# Patient Record
Sex: Female | Born: 1945 | Race: White | Hispanic: No | Marital: Married | Smoking: Current every day smoker
Health system: Southern US, Community
[De-identification: ages and names within clinical notes are randomized; demographics above are authoritative.]

## PROBLEM LIST (undated history)

## (undated) DIAGNOSIS — M797 Fibromyalgia: Secondary | ICD-10-CM

## (undated) DIAGNOSIS — I714 Abdominal aortic aneurysm, without rupture, unspecified: Secondary | ICD-10-CM

## (undated) DIAGNOSIS — N189 Chronic kidney disease, unspecified: Secondary | ICD-10-CM

## (undated) DIAGNOSIS — I10 Essential (primary) hypertension: Secondary | ICD-10-CM

## (undated) DIAGNOSIS — K219 Gastro-esophageal reflux disease without esophagitis: Secondary | ICD-10-CM

## (undated) DIAGNOSIS — E7800 Pure hypercholesterolemia, unspecified: Secondary | ICD-10-CM

## (undated) HISTORY — PX: STAPEDES SURGERY: SHX789

## (undated) HISTORY — DX: Pure hypercholesterolemia, unspecified: E78.00

## (undated) HISTORY — DX: Gastro-esophageal reflux disease without esophagitis: K21.9

## (undated) HISTORY — DX: Essential (primary) hypertension: I10

## (undated) HISTORY — DX: Abdominal aortic aneurysm, without rupture, unspecified: I71.40

## (undated) HISTORY — PX: ABDOMINAL HYSTERECTOMY: SHX81

---

## 2008-03-20 ENCOUNTER — Ambulatory Visit: Payer: Self-pay | Admitting: Cardiology

## 2008-04-18 ENCOUNTER — Ambulatory Visit: Payer: Self-pay | Admitting: Cardiology

## 2008-04-22 ENCOUNTER — Ambulatory Visit: Payer: Self-pay | Admitting: Cardiology

## 2009-07-29 DIAGNOSIS — R079 Chest pain, unspecified: Secondary | ICD-10-CM

## 2011-03-30 NOTE — Assessment & Plan Note (Signed)
[facility]                          [facility]ICE NOTE   Kelli Deleon, Kelli Deleon                          MRN:          [id number]  DATE:04/22/2008                            DOB:          Aug 28, 1946    PRIMARY CARDIOLOGIST:  Learta Codding, MD, [facility]   REASON FOR VISIT:  Scheduled followup.  Please refer to Dr. Margarita Mail  initial consultation note of Mar 20, 2008, for full details.   At that time, the patient presented as a referral for evaluation of  chest pain, which was felt to be atypical.  Her cardiac risk factors  were notable for longstanding tobacco smoking and age.  She was referred  for an exercise stress echocardiogram for risk stratification.   The stress test was completed on April 18, 2008, reviewed by Dr. Myrtis Ser,  which indicated no evidence of ischemia.  Of note, the patient exercised  into stage IV, attained 94% of PMHR, and there was no associated chest  pain or significant EKG changes.   The study was interpreted as normal, by Dr. Myrtis Ser.   These results were relayed to the patient today.  She has not had any  recurrent chest pain.   The patient is cleared from a cardiac standpoint and does not require  any further ischemic workup.  We spoke about primary prevention, with  particular emphasis on smoking cessation.  She is also advised to  monitor her blood pressure on occasion.  If this remains elevated, she  is to follow up with Dr. Sherril Croon for further recommendations.  She is to  otherwise return to Dr. Lewayne Bunting on an as-needed basis.      Gene Serpe, PA-C  Electronically Signed      Learta Codding, MD,FACC  Electronically Signed   GS/MedQ  DD: 04/22/2008  DT: 04/23/2008  Job #: [id number]   cc:   Doreen Beam, MD

## 2011-03-30 NOTE — Assessment & Plan Note (Signed)
[facility]                          [facility] NOTE   NAME:Kelli Deleon, Kelli Deleon                          MRN:          [id number]  DATE:03/20/2008                            DOB:          1946/07/07    HISTORY OF PRESENT ILLNESS:  The patient is a 65 year old female with no  prior cardiac history.  The patient had had a stress test approximately  6 years ago which she reported was within normal limits.  The patient  has a longstanding history of fibromyalgia and atypical chest pains.  The patient was admitted to the ER February 18, 2008, while experienced pain  at church for approximately 15 minutes.  This was associated with  diaphoresis.  The patient states that her pain was midsternal with some  radiation to the arm.  There was associated shortness of breath.  She  also states that for approximately 1 year she has dyspnea on exertion.  The patient smokes approximately 1-1/2 packs a day.  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  Next, the  patient states that she also has chronic right-sided chest pain from a  history of shingles.  In addition, she has a history of trigeminal  neuralgia.  She feels at times that her legs are burning but there is no  definite claudication.  She denies, however, any definite substernal  chest pressure but predominantly reports a shortness of breath on  exertion.   MEDICATIONS:  Aspirin 81 mg a day, Advil, omega-3 and Prilosec.   ALLERGIES:  VICODIN.   FAMILY HISTORY:  Mother died from cancer at age 19.  Father died from a  stroke at age 8.  She has two sisters that are in good health.  She has  eight brothers, four have died and four are alive and well but do have  diabetes mellitus.  She has several brothers who died that had heart  disease.   SOCIAL HISTORY:  The patient smokes but denies any significant  alcohol  use.   PAST MEDICAL HISTORY:  See problem list below and as outlined in the  H&P.   REVIEW OF SYSTEMS:  As per HPI.  No nausea or vomiting, no fever or  chills.  No melena, hematochezia, no dysuria or frequency.  Remainder of  review of systems negative.   PHYSICAL EXAMINATION:  VITAL SIGNS:  Blood pressure was 166/96, heart  rate 71 beats per minute.  Weight is 159 pounds.  NECK:  Normal carotid upstroke and no carotid bruits.  No thyromegaly.  LUNGS:  Clear breath sounds bilaterally.  HEART:  Regular rate and rhythm.  Normal S1 and S2.  No murmurs, rubs or  gallops.  ABDOMEN:  Soft, nontender.  No rebound or guarding.  Good bowel sounds.  EXTREMITIES:  No cyanosis, clubbing or edema.  NEUROLOGIC:  The patient is alert, oriented and grossly nonfocal.  SKIN:  Does demonstrate changes consistent with livedo reticularis.  No definite adenopathy.   PROBLEM LIST:  1.  Substernal chest pain.      a.     Rule out ischemic heart disease.      b.     History of fibromyalgias.      c.     History of shingles with right-sided chest pain.  2. To tobacco use.  3. Rule out lower extremity neuropathy.  4. 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   PLAN:  1. [name] chest pain is concerning but somewhat atypical.  She      does have significant dyspnea on exertion but she has a      longstanding history of tobacco use.  2. The patient will need further testing for ischemic heart disease      given her high pretest probability for CAD.  We will proceed with      an exercise Cardiolite stress study.  3. The patient will need to have a follow-up on her blood pressure      reading and she may need better blood pressure control.  I did give      the patient a prescription of p.r.n. nitroglycerin today.  4. I also recommended drawing some labs for possible lupus given her      skin changes.  5. The patient will follow up to further discuss the results of her      stress test and, if  abnormal, possible need for cardiac      catheterization.     Learta Codding, MD,FACC  Electronically Signed    GED/MedQ  DD: 03/21/2008  DT: 03/21/2008  Job #: [id number]   cc:   Doreen Beam, MD

## 2014-12-09 ENCOUNTER — Other Ambulatory Visit (HOSPITAL_COMMUNITY): Payer: Self-pay | Admitting: Oncology

## 2014-12-09 DIAGNOSIS — F17210 Nicotine dependence, cigarettes, uncomplicated: Secondary | ICD-10-CM

## 2014-12-11 ENCOUNTER — Ambulatory Visit (HOSPITAL_COMMUNITY): Payer: Self-pay

## 2016-11-29 DIAGNOSIS — R200 Anesthesia of skin: Secondary | ICD-10-CM | POA: Diagnosis not present

## 2016-11-29 DIAGNOSIS — E8940 Asymptomatic postprocedural ovarian failure: Secondary | ICD-10-CM | POA: Diagnosis not present

## 2016-11-29 DIAGNOSIS — Z79899 Other long term (current) drug therapy: Secondary | ICD-10-CM | POA: Diagnosis not present

## 2016-11-29 DIAGNOSIS — R5383 Other fatigue: Secondary | ICD-10-CM | POA: Diagnosis not present

## 2016-11-29 DIAGNOSIS — Z299 Encounter for prophylactic measures, unspecified: Secondary | ICD-10-CM | POA: Diagnosis not present

## 2016-11-29 DIAGNOSIS — Z0000 Encounter for general adult medical examination without abnormal findings: Secondary | ICD-10-CM | POA: Diagnosis not present

## 2016-11-29 DIAGNOSIS — Z1211 Encounter for screening for malignant neoplasm of colon: Secondary | ICD-10-CM | POA: Diagnosis not present

## 2016-11-29 DIAGNOSIS — E559 Vitamin D deficiency, unspecified: Secondary | ICD-10-CM | POA: Diagnosis not present

## 2016-11-29 DIAGNOSIS — R69 Illness, unspecified: Secondary | ICD-10-CM | POA: Diagnosis not present

## 2016-11-29 DIAGNOSIS — Z6829 Body mass index (BMI) 29.0-29.9, adult: Secondary | ICD-10-CM | POA: Diagnosis not present

## 2016-11-29 DIAGNOSIS — Z7189 Other specified counseling: Secondary | ICD-10-CM | POA: Diagnosis not present

## 2016-11-29 DIAGNOSIS — M797 Fibromyalgia: Secondary | ICD-10-CM | POA: Diagnosis not present

## 2016-11-29 DIAGNOSIS — E7800 Pure hypercholesterolemia, unspecified: Secondary | ICD-10-CM | POA: Diagnosis not present

## 2016-11-29 DIAGNOSIS — Z1389 Encounter for screening for other disorder: Secondary | ICD-10-CM | POA: Diagnosis not present

## 2016-12-14 DIAGNOSIS — E8940 Asymptomatic postprocedural ovarian failure: Secondary | ICD-10-CM | POA: Diagnosis not present

## 2017-04-22 DIAGNOSIS — I714 Abdominal aortic aneurysm, without rupture: Secondary | ICD-10-CM | POA: Diagnosis not present

## 2017-04-22 DIAGNOSIS — Z299 Encounter for prophylactic measures, unspecified: Secondary | ICD-10-CM | POA: Diagnosis not present

## 2017-04-22 DIAGNOSIS — Z79899 Other long term (current) drug therapy: Secondary | ICD-10-CM | POA: Diagnosis not present

## 2017-04-22 DIAGNOSIS — M797 Fibromyalgia: Secondary | ICD-10-CM | POA: Diagnosis not present

## 2017-04-22 DIAGNOSIS — I10 Essential (primary) hypertension: Secondary | ICD-10-CM | POA: Diagnosis not present

## 2017-04-22 DIAGNOSIS — Z6827 Body mass index (BMI) 27.0-27.9, adult: Secondary | ICD-10-CM | POA: Diagnosis not present

## 2017-04-22 DIAGNOSIS — E7800 Pure hypercholesterolemia, unspecified: Secondary | ICD-10-CM | POA: Diagnosis not present

## 2017-08-23 DIAGNOSIS — E7800 Pure hypercholesterolemia, unspecified: Secondary | ICD-10-CM | POA: Diagnosis not present

## 2017-08-23 DIAGNOSIS — E663 Overweight: Secondary | ICD-10-CM | POA: Diagnosis not present

## 2017-08-23 DIAGNOSIS — M797 Fibromyalgia: Secondary | ICD-10-CM | POA: Diagnosis not present

## 2017-08-23 DIAGNOSIS — I714 Abdominal aortic aneurysm, without rupture: Secondary | ICD-10-CM | POA: Diagnosis not present

## 2017-08-23 DIAGNOSIS — I10 Essential (primary) hypertension: Secondary | ICD-10-CM | POA: Diagnosis not present

## 2017-08-23 DIAGNOSIS — Z23 Encounter for immunization: Secondary | ICD-10-CM | POA: Diagnosis not present

## 2017-08-23 DIAGNOSIS — Z299 Encounter for prophylactic measures, unspecified: Secondary | ICD-10-CM | POA: Diagnosis not present

## 2017-09-13 DIAGNOSIS — M797 Fibromyalgia: Secondary | ICD-10-CM | POA: Diagnosis not present

## 2017-09-13 DIAGNOSIS — R69 Illness, unspecified: Secondary | ICD-10-CM | POA: Diagnosis not present

## 2017-09-13 DIAGNOSIS — I714 Abdominal aortic aneurysm, without rupture: Secondary | ICD-10-CM | POA: Diagnosis not present

## 2017-09-13 DIAGNOSIS — I10 Essential (primary) hypertension: Secondary | ICD-10-CM | POA: Diagnosis not present

## 2017-09-13 DIAGNOSIS — Z299 Encounter for prophylactic measures, unspecified: Secondary | ICD-10-CM | POA: Diagnosis not present

## 2017-09-13 DIAGNOSIS — E663 Overweight: Secondary | ICD-10-CM | POA: Diagnosis not present

## 2017-09-26 DIAGNOSIS — I714 Abdominal aortic aneurysm, without rupture: Secondary | ICD-10-CM | POA: Diagnosis not present

## 2017-12-05 DIAGNOSIS — Z1339 Encounter for screening examination for other mental health and behavioral disorders: Secondary | ICD-10-CM | POA: Diagnosis not present

## 2017-12-05 DIAGNOSIS — Z7189 Other specified counseling: Secondary | ICD-10-CM | POA: Diagnosis not present

## 2017-12-05 DIAGNOSIS — Z1331 Encounter for screening for depression: Secondary | ICD-10-CM | POA: Diagnosis not present

## 2017-12-05 DIAGNOSIS — Z0000 Encounter for general adult medical examination without abnormal findings: Secondary | ICD-10-CM | POA: Diagnosis not present

## 2017-12-05 DIAGNOSIS — Z79899 Other long term (current) drug therapy: Secondary | ICD-10-CM | POA: Diagnosis not present

## 2017-12-05 DIAGNOSIS — I714 Abdominal aortic aneurysm, without rupture: Secondary | ICD-10-CM | POA: Diagnosis not present

## 2017-12-05 DIAGNOSIS — Z299 Encounter for prophylactic measures, unspecified: Secondary | ICD-10-CM | POA: Diagnosis not present

## 2017-12-05 DIAGNOSIS — I10 Essential (primary) hypertension: Secondary | ICD-10-CM | POA: Diagnosis not present

## 2017-12-05 DIAGNOSIS — Z1211 Encounter for screening for malignant neoplasm of colon: Secondary | ICD-10-CM | POA: Diagnosis not present

## 2017-12-05 DIAGNOSIS — E7800 Pure hypercholesterolemia, unspecified: Secondary | ICD-10-CM | POA: Diagnosis not present

## 2017-12-05 DIAGNOSIS — Z6828 Body mass index (BMI) 28.0-28.9, adult: Secondary | ICD-10-CM | POA: Diagnosis not present

## 2017-12-06 DIAGNOSIS — Z6829 Body mass index (BMI) 29.0-29.9, adult: Secondary | ICD-10-CM | POA: Diagnosis not present

## 2017-12-06 DIAGNOSIS — D72829 Elevated white blood cell count, unspecified: Secondary | ICD-10-CM | POA: Diagnosis not present

## 2017-12-06 DIAGNOSIS — I10 Essential (primary) hypertension: Secondary | ICD-10-CM | POA: Diagnosis not present

## 2017-12-06 DIAGNOSIS — R69 Illness, unspecified: Secondary | ICD-10-CM | POA: Diagnosis not present

## 2017-12-06 DIAGNOSIS — N183 Chronic kidney disease, stage 3 (moderate): Secondary | ICD-10-CM | POA: Diagnosis not present

## 2017-12-06 DIAGNOSIS — E7800 Pure hypercholesterolemia, unspecified: Secondary | ICD-10-CM | POA: Diagnosis not present

## 2017-12-06 DIAGNOSIS — Z299 Encounter for prophylactic measures, unspecified: Secondary | ICD-10-CM | POA: Diagnosis not present

## 2017-12-19 DIAGNOSIS — Z1839 Other retained organic fragments: Secondary | ICD-10-CM | POA: Diagnosis not present

## 2017-12-19 DIAGNOSIS — D72829 Elevated white blood cell count, unspecified: Secondary | ICD-10-CM | POA: Diagnosis not present

## 2017-12-20 DIAGNOSIS — N183 Chronic kidney disease, stage 3 (moderate): Secondary | ICD-10-CM | POA: Diagnosis not present

## 2017-12-20 DIAGNOSIS — R69 Illness, unspecified: Secondary | ICD-10-CM | POA: Diagnosis not present

## 2017-12-20 DIAGNOSIS — Z6829 Body mass index (BMI) 29.0-29.9, adult: Secondary | ICD-10-CM | POA: Diagnosis not present

## 2017-12-20 DIAGNOSIS — D72829 Elevated white blood cell count, unspecified: Secondary | ICD-10-CM | POA: Diagnosis not present

## 2017-12-20 DIAGNOSIS — E7800 Pure hypercholesterolemia, unspecified: Secondary | ICD-10-CM | POA: Diagnosis not present

## 2017-12-20 DIAGNOSIS — Z299 Encounter for prophylactic measures, unspecified: Secondary | ICD-10-CM | POA: Diagnosis not present

## 2017-12-20 DIAGNOSIS — I714 Abdominal aortic aneurysm, without rupture: Secondary | ICD-10-CM | POA: Diagnosis not present

## 2017-12-21 DIAGNOSIS — D72829 Elevated white blood cell count, unspecified: Secondary | ICD-10-CM | POA: Diagnosis not present

## 2017-12-21 DIAGNOSIS — R05 Cough: Secondary | ICD-10-CM | POA: Diagnosis not present

## 2017-12-21 DIAGNOSIS — I700 Atherosclerosis of aorta: Secondary | ICD-10-CM | POA: Diagnosis not present

## 2017-12-21 DIAGNOSIS — R911 Solitary pulmonary nodule: Secondary | ICD-10-CM | POA: Diagnosis not present

## 2017-12-21 DIAGNOSIS — R69 Illness, unspecified: Secondary | ICD-10-CM | POA: Diagnosis not present

## 2017-12-28 DIAGNOSIS — R69 Illness, unspecified: Secondary | ICD-10-CM | POA: Diagnosis not present

## 2017-12-28 DIAGNOSIS — R05 Cough: Secondary | ICD-10-CM | POA: Diagnosis not present

## 2017-12-28 DIAGNOSIS — D72829 Elevated white blood cell count, unspecified: Secondary | ICD-10-CM | POA: Diagnosis not present

## 2017-12-28 DIAGNOSIS — J439 Emphysema, unspecified: Secondary | ICD-10-CM | POA: Diagnosis not present

## 2018-01-13 ENCOUNTER — Inpatient Hospital Stay (HOSPITAL_COMMUNITY): Payer: Medicare HMO

## 2018-01-13 ENCOUNTER — Encounter (HOSPITAL_COMMUNITY): Payer: Self-pay | Admitting: Internal Medicine

## 2018-01-13 ENCOUNTER — Inpatient Hospital Stay (HOSPITAL_COMMUNITY): Payer: Medicare HMO | Attending: Internal Medicine | Admitting: Internal Medicine

## 2018-01-13 VITALS — BP 144/71 | HR 68 | Temp 97.6°F | Resp 18 | Ht 64.0 in | Wt 169.0 lb

## 2018-01-13 DIAGNOSIS — D72828 Other elevated white blood cell count: Secondary | ICD-10-CM

## 2018-01-13 DIAGNOSIS — D72829 Elevated white blood cell count, unspecified: Secondary | ICD-10-CM | POA: Insufficient documentation

## 2018-01-13 DIAGNOSIS — Z87891 Personal history of nicotine dependence: Secondary | ICD-10-CM | POA: Diagnosis not present

## 2018-01-13 LAB — COMPREHENSIVE METABOLIC PANEL
ALT: 27 U/L (ref 14–54)
AST: 30 U/L (ref 15–41)
Albumin: 4.4 g/dL (ref 3.5–5.0)
Alkaline Phosphatase: 65 U/L (ref 38–126)
Anion gap: 9 (ref 5–15)
BUN: 16 mg/dL (ref 6–20)
CHLORIDE: 106 mmol/L (ref 101–111)
CO2: 28 mmol/L (ref 22–32)
Calcium: 9.4 mg/dL (ref 8.9–10.3)
Creatinine, Ser: 1.16 mg/dL — ABNORMAL HIGH (ref 0.44–1.00)
GFR calc Af Amer: 54 mL/min — ABNORMAL LOW (ref >60)
GFR, EST NON AFRICAN AMERICAN: 47 mL/min — AB (ref >60)
GLUCOSE: 92 mg/dL (ref 65–99)
Potassium: 4.5 mmol/L (ref 3.5–5.1)
Sodium: 143 mmol/L (ref 135–145)
TOTAL PROTEIN: 7.7 g/dL (ref 6.5–8.1)
Total Bilirubin: 0.3 mg/dL (ref 0.3–1.2)

## 2018-01-13 LAB — LACTATE DEHYDROGENASE: LDH: 150 U/L (ref 98–192)

## 2018-01-13 LAB — CBC WITH DIFFERENTIAL/PLATELET
BASOS ABS: 0.1 10*3/uL (ref 0.0–0.1)
Basophils Relative: 1 %
Eosinophils Absolute: 0.3 10*3/uL (ref 0.0–0.7)
Eosinophils Relative: 3 %
HEMATOCRIT: 42.1 % (ref 36.0–46.0)
Hemoglobin: 13.7 g/dL (ref 12.0–15.0)
LYMPHS PCT: 41 %
Lymphs Abs: 4.4 10*3/uL — ABNORMAL HIGH (ref 0.7–4.0)
MCH: 29.8 pg (ref 26.0–34.0)
MCHC: 32.5 g/dL (ref 30.0–36.0)
MCV: 91.7 fL (ref 78.0–100.0)
Monocytes Absolute: 0.5 10*3/uL (ref 0.1–1.0)
Monocytes Relative: 4 %
NEUTROS ABS: 5.5 10*3/uL (ref 1.7–7.7)
NEUTROS PCT: 51 %
Platelets: 323 10*3/uL (ref 150–400)
RBC: 4.59 MIL/uL (ref 3.87–5.11)
RDW: 13 % (ref 11.5–15.5)
WBC: 10.8 10*3/uL — ABNORMAL HIGH (ref 4.0–10.5)

## 2018-01-13 LAB — C-REACTIVE PROTEIN: CRP: <0.8 mg/dL (ref <1.0)

## 2018-01-13 LAB — SEDIMENTATION RATE: Sed Rate: 25 mm/hr — ABNORMAL HIGH (ref 0–22)

## 2018-01-13 NOTE — Progress Notes (Signed)
Referring doctor is Dr. Woody Seller  Other elevated white blood cell (WBC) count - Plan: CBC with Differential/Platelet, Comprehensive metabolic panel, Lactate dehydrogenase, BCR-ABL1, CML/ALL, PCR, QUANT, Sedimentation rate, C-reactive protein  Staging Cancer Staging No matching staging information was found for the patient.  Assessment and Plan: 1.  Leukocytosis.  I discussed with the patient her white count is minimally elevated at 11 hemoglobin is 14.8 platelets 399,000.  I suspect this is a normal variant or may be related to her history of smoking.  She will undergo lab evaluation repeat CBC, chemistries, LDH, BCR/ABL, sed rate, C-reactive protein.  Pt will return to clinic in 2 weeks to go over results of her lab studies.  Labs resulted today from clinic CBC  white count 10.8 hemoglobin 13.7 and platelet 323,000.  2.  Smoking.  Smoking cessation is recommended in this patient.  She will be given the option of lung cancer screening on return to clinic.  She reportedly had some recent imaging and indicates findings were negative.  3.  Hypertension.  Blood pressure is 144/71.  She should continue to follow-up with her primary care physician.  4.  Health maintenance.  She is recommended for mammogram evaluation if not recently performed.  She reportedly has undergone colonoscopy evaluation in the past.  HPI: 72 year old female who is referred for consultation due to  leukocytosis.  She reports she had been seen by a Heme/Onc doctor in [place]  many years ago with negative evaluations performed.  She denies any fevers chills night sweats and has noted no adenopathy.  Recent labs done on 12/19/2017 white count was 11.6 hemoglobin 14.8 platelets were 399,000.  Chemistries showed a creatinine of 1 and remainder of the chemistries were within normal.  TSH was normal at 3.1.  Labs done 11/30/2016 White count 12.1 hemoglobin 14.8 platelets 388,000.  She has a history of smoking.  She reports she is trying to cut  down.  She reports some recent imaging and was reportedly told that the scans had negative findings.  Problem List Patient Active Problem List   Diagnosis Date Noted  . CHEST PAIN-UNSPECIFIED [R07.9] 07/29/2009    Past Medical History Past Medical History:  Diagnosis Date  . GERD (gastroesophageal reflux disease)   . High cholesterol   . Hypertension     Past Surgical History Past Surgical History:  Procedure Laterality Date  . ABDOMINAL HYSTERECTOMY    . STAPEDES SURGERY Bilateral    1975 and #2 in 1990's    Family History Family History  Problem Relation Age of Onset  . Cancer Mother        lung  . Tuberculosis Mother   . Tuberculosis Father   . Stroke Father   . COPD Sister   . Hypertension Sister   . Anxiety disorder Sister   . Hypertension Sister   . Heart disease Brother   . Hypertension Brother   . Atrial fibrillation Brother   . Neuropathy Brother   . Cancer Brother   . Cancer Brother   . Cancer Brother   . Kidney disease Brother   . Lung disease Brother   . Heart attack Brother   . Pneumonia Brother   . Heart attack Brother      Social History  reports that she quit smoking today. Her smoking use included cigarettes. She has a 120.00 pack-year smoking history. she has never used smokeless tobacco. She reports that she does not drink alcohol or use drugs.  Medications  Current Outpatient Medications:  .  amLODipine (NORVASC) 5 MG tablet, , Disp: , Rfl:  .  aspirin EC 81 MG tablet, Take by mouth., Disp: , Rfl:  .  Cholecalciferol (VITAMIN D3) 1000 units CAPS, Take 1 capsule by mouth daily., Disp: , Rfl:  .  DULoxetine (CYMBALTA) 30 MG capsule, , Disp: , Rfl:  .  Multiple Vitamins-Minerals (CENTRUM SILVER 50+WOMEN PO), Take 1 tablet by mouth daily., Disp: , Rfl:  .  pantoprazole (PROTONIX) 40 MG tablet, , Disp: , Rfl:  .  pseudoephedrine (SUDAFED) 30 MG tablet, Take 30 mg by mouth every 4 (four) hours as needed for congestion., Disp: , Rfl:  .   rosuvastatin (CRESTOR) 10 MG tablet, , Disp: , Rfl:  .  traMADol (ULTRAM) 50 MG tablet, , Disp: , Rfl:   Allergies Hydrocodone-acetaminophen; Carbamazepine; Gabapentin; Lisinopril; Morphine; and Pregabalin  Review of Systems Review of Systems - Oncology ROS as per HPI otherwise 12 point ROS is negative.   Physical Exam  Vitals Wt Readings from Last 3 Encounters:  01/13/18 169 lb (76.7 kg)   Temp Readings from Last 3 Encounters:  01/13/18 97.6 F (36.4 C) (Oral)   BP Readings from Last 3 Encounters:  01/13/18 (!) 144/71   Pulse Readings from Last 3 Encounters:  01/13/18 68   Constitutional: Well-developed, well-nourished, and in no distress.   HENT: Head: Normocephalic and atraumatic.  Mouth/Throat: No oropharyngeal exudate. Mucosa moist. Eyes: Pupils are equal, round, and reactive to light. Conjunctivae are normal. No scleral icterus.  Neck: Normal range of motion. Neck supple. No JVD present.  Cardiovascular: Normal rate, regular rhythm and normal heart sounds.  Exam reveals no gallop and no friction rub.   No murmur heard. Pulmonary/Chest: Effort normal and breath sounds normal. No respiratory distress. No wheezes.No rales.  Abdominal: Soft. Bowel sounds are normal. No distension. There is no tenderness. There is no guarding.  Musculoskeletal: No edema or tenderness.  Lymphadenopathy: No cervical, axillary or supraclavicular adenopathy.  Neurological: Alert and oriented to person, place, and time. No cranial nerve deficit.  Skin: Skin is warm and dry. No rash noted. No erythema. No pallor.  Psychiatric: Affect and judgment normal.   Labs Appointment on 01/13/2018  Component Date Value Ref Range Status  . WBC 01/13/2018 10.8* 4.0 - 10.5 K/uL Final  . RBC 01/13/2018 4.59  3.87 - 5.11 MIL/uL Final  . Hemoglobin 01/13/2018 13.7  12.0 - 15.0 g/dL Final  . HCT 01/13/2018 42.1  36.0 - 46.0 % Final  . MCV 01/13/2018 91.7  78.0 - 100.0 fL Final  . MCH 01/13/2018 29.8  26.0  - 34.0 pg Final  . MCHC 01/13/2018 32.5  30.0 - 36.0 g/dL Final  . RDW 01/13/2018 13.0  11.5 - 15.5 % Final  . Platelets 01/13/2018 323  150 - 400 K/uL Final  . Neutrophils Relative % 01/13/2018 51  % Final  . Neutro Abs 01/13/2018 5.5  1.7 - 7.7 K/uL Final  . Lymphocytes Relative 01/13/2018 41  % Final  . Lymphs Abs 01/13/2018 4.4* 0.7 - 4.0 K/uL Final  . Monocytes Relative 01/13/2018 4  % Final  . Monocytes Absolute 01/13/2018 0.5  0.1 - 1.0 K/uL Final  . Eosinophils Relative 01/13/2018 3  % Final  . Eosinophils Absolute 01/13/2018 0.3  0.0 - 0.7 K/uL Final  . Basophils Relative 01/13/2018 1  % Final  . Basophils Absolute 01/13/2018 0.1  0.0 - 0.1 K/uL Final   Performed at [facility], [address], [place], [place] [address]  Pathology Orders Placed This Encounter  Procedures  . CBC with Differential/Platelet    Standing Status:   Future    Number of Occurrences:   1    Standing Expiration Date:   01/14/2019  . Comprehensive metabolic panel    Standing Status:   Future    Number of Occurrences:   1    Standing Expiration Date:   01/14/2019  . Lactate dehydrogenase    Standing Status:   Future    Number of Occurrences:   1    Standing Expiration Date:   01/14/2019  . BCR-ABL1, CML/ALL, PCR, QUANT  . Sedimentation rate    Standing Status:   Future    Number of Occurrences:   1    Standing Expiration Date:   01/13/2019  . C-reactive protein    Standing Status:   Future    Number of Occurrences:   1    Standing Expiration Date:   01/13/2019     Zoila Shutter MD   Cc:  Dr.  Woody Seller

## 2018-01-13 NOTE — Patient Instructions (Addendum)
[place] at [facility] Discharge Instructions   You were seen today by Dr. Zoila Shutter We would like for you to get your mammogram yearly It may be time for you to get your colonoscopy as well.   The values on your blood counts could be elevated due to your history of smoking We will do some more lab work today Follow up with [place] in 2 weeks to review the labs    Thank you for choosing [place] at [facility] to provide your oncology and hematology care.  To afford each patient quality time with our provider, please arrive at least 15 minutes before your scheduled appointment time.    If you have a lab appointment with the [place] please come in thru the  Main Entrance and check in at the main information desk  You need to re-schedule your appointment should you arrive 10 or more minutes late.  We strive to give you quality time with our providers, and arriving late affects you and other patients whose appointments are after yours.  Also, if you no show three or more times for appointments you may be dismissed from the clinic at the providers discretion.     Again, thank you for choosing [facility].  Our hope is that these requests will decrease the amount of time that you wait before being seen by our physicians.       _____________________________________________________________  Should you have questions after your visit to [facility], please contact our office at [phone] between the hours of 8:30 a.m. and 4:30 p.m.  Voicemails left after 4:30 p.m. will not be returned until the following business day.  For prescription refill requests, have your pharmacy contact our office.       Resources For Cancer Patients and their Caregivers ? American Cancer Society: Can assist with transportation, wigs, general needs, runs Look Good Feel Better.        415 367 9888 ? Cancer Care: Provides financial  assistance, online support groups, medication/co-pay assistance.  1-800-813-HOPE (512) 803-5860) ? [place] Assists [place] cancer patients and their families through emotional , educational and financial support.  (513)781-3418 ? [place] DSS Where to apply for food stamps, Medicaid and utility assistance. (249)652-9299 ? [employer]: Transportation to medical appointments. 719-712-8602 ? Social Security Administration: May apply for disability if have a Stage IV cancer. (279) 873-5800 (701)859-9182 ? [employer], Disability and Transit Services: Assists with nutrition, care and transit needs. [place] Support Programs: @10RELATIVEDAYS @  > Cancer Support Group  2nd Tuesday of the month 1pm-2pm, Journey Room   > Creative Journey  3rd Tuesday of the month 1130am-1pm, Journey Room

## 2018-01-18 LAB — BCR-ABL1, CML/ALL, PCR, QUANT

## 2018-01-31 ENCOUNTER — Ambulatory Visit (HOSPITAL_COMMUNITY): Payer: Medicare HMO | Admitting: Hematology

## 2018-02-03 ENCOUNTER — Inpatient Hospital Stay (HOSPITAL_BASED_OUTPATIENT_CLINIC_OR_DEPARTMENT_OTHER): Payer: Medicare HMO | Admitting: Internal Medicine

## 2018-02-03 ENCOUNTER — Encounter (HOSPITAL_COMMUNITY): Payer: Self-pay | Admitting: Internal Medicine

## 2018-02-03 VITALS — BP 142/70 | HR 73 | Temp 97.9°F | Resp 16 | Wt 168.7 lb

## 2018-02-03 DIAGNOSIS — Z87891 Personal history of nicotine dependence: Secondary | ICD-10-CM | POA: Diagnosis not present

## 2018-02-03 DIAGNOSIS — D72829 Elevated white blood cell count, unspecified: Secondary | ICD-10-CM | POA: Diagnosis not present

## 2018-02-03 DIAGNOSIS — F17200 Nicotine dependence, unspecified, uncomplicated: Secondary | ICD-10-CM

## 2018-02-03 DIAGNOSIS — I10 Essential (primary) hypertension: Secondary | ICD-10-CM | POA: Diagnosis not present

## 2018-02-03 DIAGNOSIS — D72828 Other elevated white blood cell count: Secondary | ICD-10-CM

## 2018-02-03 NOTE — Patient Instructions (Addendum)
[place] at [facility] Discharge Instructions  You were seen today by Dr. Walden Field. She went over your recent lab results and everything looked good. She discussed the Lung Cancer Screening Program and how your smoking could make your blood work fluctuate. We will refer you to that program, and you will something from them regarding scheduling. We will see you back in 6 months for labs and follow up.   Thank you for choosing [place] at [facility] to provide your oncology and hematology care.  To afford each patient quality time with our provider, please arrive at least 15 minutes before your scheduled appointment time.   If you have a lab appointment with the [place] please come in thru the  Main Entrance and check in at the main information desk  You need to re-schedule your appointment should you arrive 10 or more minutes late.  We strive to give you quality time with our providers, and arriving late affects you and other patients whose appointments are after yours.  Also, if you no show three or more times for appointments you may be dismissed from the clinic at the providers discretion.     Again, thank you for choosing [facility].  Our hope is that these requests will decrease the amount of time that you wait before being seen by our physicians.       _____________________________________________________________  Should you have questions after your visit to [facility], please contact our office at [phone] between the hours of 8:30 a.m. and 4:30 p.m.  Voicemails left after 4:30 p.m. will not be returned until the following business day.  For prescription refill requests, have your pharmacy contact our office.       Resources For Cancer Patients and their Caregivers ? American Cancer Society: Can assist with transportation, wigs, general needs, runs Look Good Feel Better.         747-633-0650 ? Cancer Care: Provides financial assistance, online support groups, medication/co-pay assistance.  1-800-813-HOPE (712)339-6457) ? [place] Assists [place] cancer patients and their families through emotional , educational and financial support.  539-813-6117 ? [place] DSS Where to apply for food stamps, Medicaid and utility assistance. 9131631859 ? [employer]: Transportation to medical appointments. (339)029-3779 ? Social Security Administration: May apply for disability if have a Stage IV cancer. (904)251-1985 772-741-3361 ? [employer], Disability and Transit Services: Assists with nutrition, care and transit needs. [place] Support Programs:   > Cancer Support Group  2nd Tuesday of the month 1pm-2pm, Journey Room   > Creative Journey  3rd Tuesday of the month 1130am-1pm, Journey Room

## 2018-02-06 ENCOUNTER — Other Ambulatory Visit: Payer: Self-pay | Admitting: Acute Care

## 2018-02-06 DIAGNOSIS — Z122 Encounter for screening for malignant neoplasm of respiratory organs: Secondary | ICD-10-CM

## 2018-02-06 DIAGNOSIS — F17210 Nicotine dependence, cigarettes, uncomplicated: Secondary | ICD-10-CM

## 2018-02-13 ENCOUNTER — Telehealth: Payer: Self-pay | Admitting: Acute Care

## 2018-02-15 ENCOUNTER — Encounter: Payer: Medicare HMO | Admitting: Acute Care

## 2018-02-15 ENCOUNTER — Inpatient Hospital Stay: Admission: RE | Admit: 2018-02-15 | Payer: Medicare HMO | Source: Ambulatory Visit

## 2018-02-15 NOTE — Telephone Encounter (Signed)
LMTC x 1  

## 2018-02-17 NOTE — Telephone Encounter (Signed)
LMTC x 1  

## 2018-02-24 NOTE — Telephone Encounter (Signed)
Spoke with pt .  She wants to wait to schedule lung screening due to having a lot of things going on.  Will defer for now.  Will close this message and refer to referral notes.

## 2018-03-08 DIAGNOSIS — Z299 Encounter for prophylactic measures, unspecified: Secondary | ICD-10-CM | POA: Diagnosis not present

## 2018-03-08 DIAGNOSIS — I10 Essential (primary) hypertension: Secondary | ICD-10-CM | POA: Diagnosis not present

## 2018-03-08 DIAGNOSIS — M797 Fibromyalgia: Secondary | ICD-10-CM | POA: Diagnosis not present

## 2018-03-08 DIAGNOSIS — I714 Abdominal aortic aneurysm, without rupture: Secondary | ICD-10-CM | POA: Diagnosis not present

## 2018-03-08 DIAGNOSIS — Z79899 Other long term (current) drug therapy: Secondary | ICD-10-CM | POA: Diagnosis not present

## 2018-03-08 DIAGNOSIS — E7800 Pure hypercholesterolemia, unspecified: Secondary | ICD-10-CM | POA: Diagnosis not present

## 2018-03-08 DIAGNOSIS — Z6829 Body mass index (BMI) 29.0-29.9, adult: Secondary | ICD-10-CM | POA: Diagnosis not present

## 2018-03-08 DIAGNOSIS — N183 Chronic kidney disease, stage 3 (moderate): Secondary | ICD-10-CM | POA: Diagnosis not present

## 2018-04-26 NOTE — Progress Notes (Signed)
Diagnosis Other elevated white blood cell (WBC) count - Plan: CBC with Differential/Platelet, Comprehensive metabolic panel, Lactate dehydrogenase  Smoking - Plan: Ambulatory Referral for Lung Cancer Screening  Staging Cancer Staging No matching staging information was found for the patient.  Assessment and Plan:  1.  Leukocytosis.  I discussed with the patient her white count is minimally elevated at 11 hemoglobin is 14.8 platelets 399,000.  I suspect this is a normal variant or may be related to her history of smoking.  Labs done 01/13/2018 reviewed with pt and showed WBC 10.8 HB 13.7 and plts 323,000 which is normal.  BCR/ABL was negative.  She will RTC in 07/2018 for follow-up and repeat labs.   2.  Smoking.  Smoking cessation is recommended in this patient.  She is referred to lung cancer screening program.    3.  Hypertension.  Blood pressure is 142/70.  Follow-up with PCP.   4.  Health maintenance.  Continue mammogram and GI evaluations as recommended.  She is recommended for mammogram evaluation if not recently performed.  She reportedly has undergone colonoscopy evaluation in the past.  Interval History:  72 year old female seen initially for consultation due to  leukocytosis.  She reports she had been seen by a Heme/Onc doctor in [place]  many years ago with negative evaluations performed.  She denies any fevers chills night sweats and has noted no adenopathy.  Labs done on 12/19/2017 white count was 11.6 hemoglobin 14.8 platelets were 399,000.  Chemistries showed a creatinine of 1 and remainder of the chemistries were within normal.  TSH was normal at 3.1.  Labs done 11/30/2016 White count 12.1 hemoglobin 14.8 platelets 388,000.  She has a history of smoking.    Current Status:  Pt is seen today for follow-up.  She is here to go over labs.    Problem List Patient Active Problem List   Diagnosis Date Noted  . CHEST PAIN-UNSPECIFIED [R07.9] 07/29/2009    Past Medical History Past  Medical History:  Diagnosis Date  . GERD (gastroesophageal reflux disease)   . High cholesterol   . Hypertension     Past Surgical History Past Surgical History:  Procedure Laterality Date  . ABDOMINAL HYSTERECTOMY    . STAPEDES SURGERY Bilateral    1975 and #2 in 1990's    Family History Family History  Problem Relation Age of Onset  . Cancer Mother        lung  . Tuberculosis Mother   . Tuberculosis Father   . Stroke Father   . COPD Sister   . Hypertension Sister   . Anxiety disorder Sister   . Hypertension Sister   . Heart disease Brother   . Hypertension Brother   . Atrial fibrillation Brother   . Neuropathy Brother   . Cancer Brother   . Cancer Brother   . Cancer Brother   . Kidney disease Brother   . Lung disease Brother   . Heart attack Brother   . Pneumonia Brother   . Heart attack Brother      Social History  reports that she quit smoking about 3 months ago. Her smoking use included cigarettes. She has a 120.00 pack-year smoking history. She has never used smokeless tobacco. She reports that she does not drink alcohol or use drugs.  Medications  Current Outpatient Medications:  .  amLODipine (NORVASC) 5 MG tablet, , Disp: , Rfl:  .  aspirin EC 81 MG tablet, Take by mouth., Disp: , Rfl:  .  Cholecalciferol (VITAMIN D3) 1000 units CAPS, Take 1 capsule by mouth daily., Disp: , Rfl:  .  DULoxetine (CYMBALTA) 30 MG capsule, , Disp: , Rfl:  .  Multiple Vitamins-Minerals (CENTRUM SILVER 50+WOMEN PO), Take 1 tablet by mouth daily., Disp: , Rfl:  .  pantoprazole (PROTONIX) 40 MG tablet, , Disp: , Rfl:  .  pseudoephedrine (SUDAFED) 30 MG tablet, Take 30 mg by mouth every 4 (four) hours as needed for congestion., Disp: , Rfl:  .  rosuvastatin (CRESTOR) 10 MG tablet, , Disp: , Rfl:  .  traMADol (ULTRAM) 50 MG tablet, , Disp: , Rfl:   Allergies Hydrocodone-acetaminophen; Carbamazepine; Gabapentin; Lisinopril; Morphine; and Pregabalin  Review of Systems Review  of Systems - Oncology ROS as per HPI otherwise 12 point ROS is negative.   Physical Exam  Vitals Wt Readings from Last 3 Encounters:  02/03/18 168 lb 11.2 oz (76.5 kg)  01/13/18 169 lb (76.7 kg)   Temp Readings from Last 3 Encounters:  02/03/18 97.9 F (36.6 C) (Oral)  01/13/18 97.6 F (36.4 C) (Oral)   BP Readings from Last 3 Encounters:  02/03/18 (!) 142/70  01/13/18 (!) 144/71   Pulse Readings from Last 3 Encounters:  02/03/18 73  01/13/18 68    Constitutional: Well-developed, well-nourished, and in no distress.   HENT: Head: Normocephalic and atraumatic.  Mouth/Throat: No oropharyngeal exudate. Mucosa moist. Eyes: Pupils are equal, round, and reactive to light. Conjunctivae are normal. No scleral icterus.  Neck: Normal range of motion. Neck supple. No JVD present.  Cardiovascular: Normal rate, regular rhythm and normal heart sounds.  Exam reveals no gallop and no friction rub.   No murmur heard. Pulmonary/Chest: Effort normal and breath sounds normal. No respiratory distress. No wheezes.No rales.  Abdominal: Soft. Bowel sounds are normal. No distension. There is no tenderness. There is no guarding.  Musculoskeletal: No edema or tenderness.  Lymphadenopathy: No cervical, axillary or supraclavicular adenopathy.  Neurological: Alert and oriented to person, place, and time. No cranial nerve deficit.  Skin: Skin is warm and dry. No rash noted. No erythema. No pallor.  Psychiatric: Affect and judgment normal.   Labs No visits with results within 3 Day(s) from this visit.  Latest known visit with results is:  Appointment on 01/13/2018  Component Date Value Ref Range Status  . WBC 01/13/2018 10.8* 4.0 - 10.5 K/uL Final  . RBC 01/13/2018 4.59  3.87 - 5.11 MIL/uL Final  . Hemoglobin 01/13/2018 13.7  12.0 - 15.0 g/dL Final  . HCT 01/13/2018 42.1  36.0 - 46.0 % Final  . MCV 01/13/2018 91.7  78.0 - 100.0 fL Final  . MCH 01/13/2018 29.8  26.0 - 34.0 pg Final  . MCHC  01/13/2018 32.5  30.0 - 36.0 g/dL Final  . RDW 01/13/2018 13.0  11.5 - 15.5 % Final  . Platelets 01/13/2018 323  150 - 400 K/uL Final  . Neutrophils Relative % 01/13/2018 51  % Final  . Neutro Abs 01/13/2018 5.5  1.7 - 7.7 K/uL Final  . Lymphocytes Relative 01/13/2018 41  % Final  . Lymphs Abs 01/13/2018 4.4* 0.7 - 4.0 K/uL Final  . Monocytes Relative 01/13/2018 4  % Final  . Monocytes Absolute 01/13/2018 0.5  0.1 - 1.0 K/uL Final  . Eosinophils Relative 01/13/2018 3  % Final  . Eosinophils Absolute 01/13/2018 0.3  0.0 - 0.7 K/uL Final  . Basophils Relative 01/13/2018 1  % Final  . Basophils Absolute 01/13/2018 0.1  0.0 - 0.1 K/uL Final  Performed at [facility], [address], [place], [place] [address]  . Sodium 01/13/2018 143  135 - 145 mmol/L Final  . Potassium 01/13/2018 4.5  3.5 - 5.1 mmol/L Final  . Chloride 01/13/2018 106  101 - 111 mmol/L Final  . CO2 01/13/2018 28  22 - 32 mmol/L Final  . Glucose, Bld 01/13/2018 92  65 - 99 mg/dL Final  . BUN 01/13/2018 16  6 - 20 mg/dL Final  . Creatinine, Ser 01/13/2018 1.16* 0.44 - 1.00 mg/dL Final  . Calcium 01/13/2018 9.4  8.9 - 10.3 mg/dL Final  . Total Protein 01/13/2018 7.7  6.5 - 8.1 g/dL Final  . Albumin 01/13/2018 4.4  3.5 - 5.0 g/dL Final  . AST 01/13/2018 30  15 - 41 U/L Final  . ALT 01/13/2018 27  14 - 54 U/L Final  . Alkaline Phosphatase 01/13/2018 65  38 - 126 U/L Final  . Total Bilirubin 01/13/2018 0.3  0.3 - 1.2 mg/dL Final  . GFR calc non Af Amer 01/13/2018 47* >60 mL/min Final  . GFR calc Af Amer 01/13/2018 54* >60 mL/min Final   Comment: (NOTE) The eGFR has been calculated using the CKD EPI equation. This calculation has not been validated in all clinical situations. eGFR's persistently <60 mL/min signify possible Chronic Kidney Disease.   Georgiann Hahn gap 01/13/2018 9  5 - 15 Final   Performed at [facility], [address], [place], [place] [address]  . LDH 01/13/2018 150  98 - 192 U/L Final   Performed at [facility], [address], [place], [place] [address]  . Sed Rate 01/13/2018 25* 0 - 22 mm/hr Final   Performed at [facility], [address], [place], [place] [address]  . CRP 01/13/2018 <0.8  <1.0 mg/dL Final   Performed at [facility] [address], [place], [place] [address]     Pathology Orders Placed This Encounter  Procedures  . CBC with Differential/Platelet    Standing Status:   Future    Standing Expiration Date:   02/04/2019  . Comprehensive metabolic panel    Standing Status:   Future    Standing Expiration Date:   02/04/2019  . Lactate dehydrogenase    Standing Status:   Future    Standing Expiration Date:   02/04/2019  . Ambulatory Referral for Lung Cancer Screening    Referral Priority:   Routine    Referral Type:   Consultation    Referral Reason:   Specialty Services Required    Number of Visits Requested:   [place] MD

## 2018-05-29 DIAGNOSIS — H9203 Otalgia, bilateral: Secondary | ICD-10-CM | POA: Diagnosis not present

## 2018-05-29 DIAGNOSIS — H9191 Unspecified hearing loss, right ear: Secondary | ICD-10-CM | POA: Diagnosis not present

## 2018-05-29 DIAGNOSIS — H6121 Impacted cerumen, right ear: Secondary | ICD-10-CM | POA: Diagnosis not present

## 2018-06-13 DIAGNOSIS — I714 Abdominal aortic aneurysm, without rupture: Secondary | ICD-10-CM | POA: Diagnosis not present

## 2018-06-13 DIAGNOSIS — Z6829 Body mass index (BMI) 29.0-29.9, adult: Secondary | ICD-10-CM | POA: Diagnosis not present

## 2018-06-13 DIAGNOSIS — I10 Essential (primary) hypertension: Secondary | ICD-10-CM | POA: Diagnosis not present

## 2018-06-13 DIAGNOSIS — N183 Chronic kidney disease, stage 3 (moderate): Secondary | ICD-10-CM | POA: Diagnosis not present

## 2018-06-13 DIAGNOSIS — Z299 Encounter for prophylactic measures, unspecified: Secondary | ICD-10-CM | POA: Diagnosis not present

## 2018-06-13 DIAGNOSIS — E785 Hyperlipidemia, unspecified: Secondary | ICD-10-CM | POA: Diagnosis not present

## 2018-07-31 ENCOUNTER — Inpatient Hospital Stay (HOSPITAL_COMMUNITY): Payer: Medicare HMO

## 2018-07-31 DIAGNOSIS — D72828 Other elevated white blood cell count: Secondary | ICD-10-CM | POA: Insufficient documentation

## 2018-08-01 DIAGNOSIS — I10 Essential (primary) hypertension: Secondary | ICD-10-CM | POA: Diagnosis not present

## 2018-08-01 DIAGNOSIS — Z299 Encounter for prophylactic measures, unspecified: Secondary | ICD-10-CM | POA: Diagnosis not present

## 2018-08-01 DIAGNOSIS — Z6829 Body mass index (BMI) 29.0-29.9, adult: Secondary | ICD-10-CM | POA: Diagnosis not present

## 2018-08-01 DIAGNOSIS — B379 Candidiasis, unspecified: Secondary | ICD-10-CM | POA: Diagnosis not present

## 2018-08-01 DIAGNOSIS — Z713 Dietary counseling and surveillance: Secondary | ICD-10-CM | POA: Diagnosis not present

## 2018-08-07 ENCOUNTER — Inpatient Hospital Stay (HOSPITAL_COMMUNITY): Payer: Medicare HMO

## 2018-08-07 ENCOUNTER — Other Ambulatory Visit: Payer: Self-pay

## 2018-08-07 ENCOUNTER — Inpatient Hospital Stay (HOSPITAL_COMMUNITY): Payer: Medicare HMO | Attending: Internal Medicine | Admitting: Internal Medicine

## 2018-08-07 ENCOUNTER — Encounter (HOSPITAL_COMMUNITY): Payer: Self-pay | Admitting: Internal Medicine

## 2018-08-07 VITALS — BP 137/72 | HR 60 | Temp 98.4°F | Resp 18 | Wt 168.5 lb

## 2018-08-07 DIAGNOSIS — R059 Cough, unspecified: Secondary | ICD-10-CM

## 2018-08-07 DIAGNOSIS — I10 Essential (primary) hypertension: Secondary | ICD-10-CM | POA: Diagnosis not present

## 2018-08-07 DIAGNOSIS — D72828 Other elevated white blood cell count: Secondary | ICD-10-CM | POA: Insufficient documentation

## 2018-08-07 DIAGNOSIS — Z720 Tobacco use: Secondary | ICD-10-CM | POA: Diagnosis not present

## 2018-08-07 DIAGNOSIS — R062 Wheezing: Secondary | ICD-10-CM | POA: Diagnosis not present

## 2018-08-07 DIAGNOSIS — R05 Cough: Secondary | ICD-10-CM | POA: Diagnosis not present

## 2018-08-07 DIAGNOSIS — R0602 Shortness of breath: Secondary | ICD-10-CM | POA: Insufficient documentation

## 2018-08-07 LAB — CBC WITH DIFFERENTIAL/PLATELET
BASOS PCT: 1 %
Basophils Absolute: 0.1 10*3/uL (ref 0.0–0.1)
EOS ABS: 0.4 10*3/uL (ref 0.0–0.7)
Eosinophils Relative: 3 %
HEMATOCRIT: 41.1 % (ref 36.0–46.0)
HEMOGLOBIN: 13.7 g/dL (ref 12.0–15.0)
Lymphocytes Relative: 40 %
Lymphs Abs: 4.6 10*3/uL — ABNORMAL HIGH (ref 0.7–4.0)
MCH: 30 pg (ref 26.0–34.0)
MCHC: 33.3 g/dL (ref 30.0–36.0)
MCV: 90.1 fL (ref 78.0–100.0)
Monocytes Absolute: 0.5 10*3/uL (ref 0.1–1.0)
Monocytes Relative: 5 %
NEUTROS PCT: 51 %
Neutro Abs: 5.8 10*3/uL (ref 1.7–7.7)
Platelets: 344 10*3/uL (ref 150–400)
RBC: 4.56 MIL/uL (ref 3.87–5.11)
RDW: 13.8 % (ref 11.5–15.5)
WBC: 11.3 10*3/uL — AB (ref 4.0–10.5)

## 2018-08-07 LAB — COMPREHENSIVE METABOLIC PANEL
ALBUMIN: 4.5 g/dL (ref 3.5–5.0)
ALK PHOS: 61 U/L (ref 38–126)
ALT: 24 U/L (ref 0–44)
AST: 34 U/L (ref 15–41)
Anion gap: 8 (ref 5–15)
BUN: 14 mg/dL (ref 8–23)
CALCIUM: 9.3 mg/dL (ref 8.9–10.3)
CO2: 24 mmol/L (ref 22–32)
CREATININE: 1.1 mg/dL — AB (ref 0.44–1.00)
Chloride: 107 mmol/L (ref 98–111)
GFR calc Af Amer: 58 mL/min — ABNORMAL LOW (ref >60)
GFR calc non Af Amer: 50 mL/min — ABNORMAL LOW (ref >60)
Glucose, Bld: 97 mg/dL (ref 70–99)
Potassium: 4.1 mmol/L (ref 3.5–5.1)
SODIUM: 139 mmol/L (ref 135–145)
Total Bilirubin: 0.6 mg/dL (ref 0.3–1.2)
Total Protein: 7.7 g/dL (ref 6.5–8.1)

## 2018-08-07 LAB — LACTATE DEHYDROGENASE: LDH: 157 U/L (ref 98–192)

## 2018-08-07 NOTE — Progress Notes (Signed)
Diagnosis Wheeze - Plan: CBC with Differential/Platelet, Comprehensive metabolic panel, Lactate dehydrogenase  Cough - Plan: CBC with Differential/Platelet, Comprehensive metabolic panel, Lactate dehydrogenase, CT CHEST W CONTRAST  Tobacco use - Plan: CBC with Differential/Platelet, Comprehensive metabolic panel, Lactate dehydrogenase, CT CHEST W CONTRAST  Other elevated white blood cell (WBC) count - Plan: CBC with Differential/Platelet, Comprehensive metabolic panel, Lactate dehydrogenase  Staging Cancer Staging No matching staging information was found for the patient.  Assessment and Plan:   1.  Leukocytosis. Labs done 08/07/2018 reviewed and showed WBC 11.3 HB 13.7 plts 344,000.  Chemistries WNL with K+ 4.1 Cr. 1.10.  Normal LFTS.  BCR/ABL was negative.  I have again discussed with her WBC Is minimally elevated and is likely a normal variant related to smoking history.  She will RTC 02/2019 for follow-up and labs pending scan results.    2.  Shortness of breath/cough.  Pt has greater than 30 year history of smoking.  Pulse ox 96% on room air.  She has wheezes noted on exam.  Pt will undergo CT of chest for further evaluation.  She will be notified of results.   3.  Hypertension.  Blood pressure is 137/72.   Follow-up with PCP.   4.  Health maintenance.  Continue mammogram and GI evaluations as recommended.   30 minutes spent with more than 50% spent in counseling and coordination of care.    Interval History:  Historical data obtained from note dated 02/03/2018.  72 year old female seen initially for consultation due to  leukocytosis.  She reports she had been seen by a Heme/Onc doctor in [place]  many years ago with negative evaluations performed.  She denies any fevers chills night sweats and has noted no adenopathy.  Labs done on 12/19/2017 white count was 11.6 hemoglobin 14.8 platelets were 399,000.  Chemistries showed a creatinine of 1 and remainder of the chemistries were within normal.   TSH was normal at 3.1.  Labs done 11/30/2016 White count 12.1 hemoglobin 14.8 platelets 388,000.  She has a history of smoking.    Current Status:  Pt is seen today for follow-up.  She is here to go over labs.  She reports mild SOB.    Problem List Patient Active Problem List   Diagnosis Date Noted  . CHEST PAIN-UNSPECIFIED [R07.9] 07/29/2009    Past Medical History Past Medical History:  Diagnosis Date  . GERD (gastroesophageal reflux disease)   . High cholesterol   . Hypertension     Past Surgical History Past Surgical History:  Procedure Laterality Date  . ABDOMINAL HYSTERECTOMY    . STAPEDES SURGERY Bilateral    1975 and #2 in 1990's    Family History Family History  Problem Relation Age of Onset  . Cancer Mother        lung  . Tuberculosis Mother   . Tuberculosis Father   . Stroke Father   . COPD Sister   . Hypertension Sister   . Anxiety disorder Sister   . Hypertension Sister   . Heart disease Brother   . Hypertension Brother   . Atrial fibrillation Brother   . Neuropathy Brother   . Cancer Brother   . Cancer Brother   . Cancer Brother   . Kidney disease Brother   . Lung disease Brother   . Heart attack Brother   . Pneumonia Brother   . Heart attack Brother      Social History  reports that she quit smoking about 6 months ago. Her  smoking use included cigarettes. She has a 120.00 pack-year smoking history. She has never used smokeless tobacco. She reports that she does not drink alcohol or use drugs.  Medications  Current Outpatient Medications:  .  amLODipine (NORVASC) 5 MG tablet, , Disp: , Rfl:  .  aspirin EC 81 MG tablet, Take by mouth., Disp: , Rfl:  .  Cholecalciferol (VITAMIN D3) 1000 units CAPS, Take 1 capsule by mouth daily., Disp: , Rfl:  .  DULoxetine (CYMBALTA) 30 MG capsule, , Disp: , Rfl:  .  ketoconazole (NIZORAL) 2 % cream, APPLY CREAM TOPICALLY TO AFFECTED AREA TWICE DAILY TO THE GROIN AREA, Disp: , Rfl: 1 .  Multiple  Vitamins-Minerals (CENTRUM SILVER 50+WOMEN PO), Take 1 tablet by mouth daily., Disp: , Rfl:  .  pantoprazole (PROTONIX) 40 MG tablet, , Disp: , Rfl:  .  pseudoephedrine (SUDAFED) 30 MG tablet, Take 30 mg by mouth every 4 (four) hours as needed for congestion., Disp: , Rfl:  .  rosuvastatin (CRESTOR) 10 MG tablet, , Disp: , Rfl:  .  traMADol (ULTRAM) 50 MG tablet, , Disp: , Rfl:   Allergies Hydrocodone-acetaminophen; Carbamazepine; Gabapentin; Lisinopril; Morphine; and Pregabalin  Review of Systems Review of Systems - Oncology ROS negative other than SOB.     Physical Exam  Vitals Wt Readings from Last 3 Encounters:  08/07/18 168 lb 8 oz (76.4 kg)  02/03/18 168 lb 11.2 oz (76.5 kg)  01/13/18 169 lb (76.7 kg)   Temp Readings from Last 3 Encounters:  08/07/18 98.4 F (36.9 C) (Oral)  02/03/18 97.9 F (36.6 C) (Oral)  01/13/18 97.6 F (36.4 C) (Oral)   BP Readings from Last 3 Encounters:  08/07/18 137/72  02/03/18 (!) 142/70  01/13/18 (!) 144/71   Pulse Readings from Last 3 Encounters:  08/07/18 60  02/03/18 73  01/13/18 68    Constitutional: Well-developed, well-nourished, and in no distress.   HENT: Head: Normocephalic and atraumatic.  Mouth/Throat: No oropharyngeal exudate. Mucosa moist. Eyes: Pupils are equal, round, and reactive to light. Conjunctivae are normal. No scleral icterus.  Neck: Normal range of motion. Neck supple. No JVD present.  Cardiovascular: Normal rate, regular rhythm and normal heart sounds.  Exam reveals no gallop and no friction rub.   No murmur heard. Pulmonary/Chest: Effort normal.  Coarse BS. Few wheezes noted bilaterally.   Abdominal: Soft. Bowel sounds are normal. No distension. There is no tenderness. There is no guarding.  Musculoskeletal: No edema or tenderness.  Lymphadenopathy: No cervical, axillary or supraclavicular adenopathy.  Neurological: Alert and oriented to person, place, and time. No cranial nerve deficit.  Skin: Skin is  warm and dry. No rash noted. No erythema. No pallor.  Psychiatric: Affect and judgment normal.   Labs Appointment on 08/07/2018  Component Date Value Ref Range Status  . WBC 08/07/2018 11.3* 4.0 - 10.5 K/uL Final  . RBC 08/07/2018 4.56  3.87 - 5.11 MIL/uL Final  . Hemoglobin 08/07/2018 13.7  12.0 - 15.0 g/dL Final  . HCT 08/07/2018 41.1  36.0 - 46.0 % Final  . MCV 08/07/2018 90.1  78.0 - 100.0 fL Final  . MCH 08/07/2018 30.0  26.0 - 34.0 pg Final  . MCHC 08/07/2018 33.3  30.0 - 36.0 g/dL Final  . RDW 08/07/2018 13.8  11.5 - 15.5 % Final  . Platelets 08/07/2018 344  150 - 400 K/uL Final  . Neutrophils Relative % 08/07/2018 51  % Final  . Neutro Abs 08/07/2018 5.8  1.7 - 7.7 K/uL  Final  . Lymphocytes Relative 08/07/2018 40  % Final  . Lymphs Abs 08/07/2018 4.6* 0.7 - 4.0 K/uL Final  . Monocytes Relative 08/07/2018 5  % Final  . Monocytes Absolute 08/07/2018 0.5  0.1 - 1.0 K/uL Final  . Eosinophils Relative 08/07/2018 3  % Final  . Eosinophils Absolute 08/07/2018 0.4  0.0 - 0.7 K/uL Final  . Basophils Relative 08/07/2018 1  % Final  . Basophils Absolute 08/07/2018 0.1  0.0 - 0.1 K/uL Final   Performed at [facility], [address], [place], [place] [address]  . Sodium 08/07/2018 139  135 - 145 mmol/L Final  . Potassium 08/07/2018 4.1  3.5 - 5.1 mmol/L Final  . Chloride 08/07/2018 107  98 - 111 mmol/L Final  . CO2 08/07/2018 24  22 - 32 mmol/L Final  . Glucose, Bld 08/07/2018 97  70 - 99 mg/dL Final  . BUN 08/07/2018 14  8 - 23 mg/dL Final  . Creatinine, Ser 08/07/2018 1.10* 0.44 - 1.00 mg/dL Final  . Calcium 08/07/2018 9.3  8.9 - 10.3 mg/dL Final  . Total Protein 08/07/2018 7.7  6.5 - 8.1 g/dL Final  . Albumin 08/07/2018 4.5  3.5 - 5.0 g/dL Final  . AST 08/07/2018 34  15 - 41 U/L Final  . ALT 08/07/2018 24  0 - 44 U/L Final  . Alkaline Phosphatase 08/07/2018 61  38 - 126 U/L Final  . Total Bilirubin 08/07/2018 0.6  0.3 - 1.2 mg/dL Final  . GFR calc non Af Amer 08/07/2018 50* >60  mL/min Final  . GFR calc Af Amer 08/07/2018 58* >60 mL/min Final   Comment: (NOTE) The eGFR has been calculated using the CKD EPI equation. This calculation has not been validated in all clinical situations. eGFR's persistently <60 mL/min signify possible Chronic Kidney Disease.   Georgiann Hahn gap 08/07/2018 8  5 - 15 Final   Performed at [facility], [address], [place], [place] [address]  . LDH 08/07/2018 157  98 - 192 U/L Final   Performed at [facility], [address], [place], [place] [address]     Pathology Orders Placed This Encounter  Procedures  . CT CHEST W CONTRAST    Standing Status:   Future    Standing Expiration Date:   08/07/2019    Order Specific Question:   If indicated for the ordered procedure, I authorize the administration of contrast media per Radiology protocol    Answer:   Yes    Order Specific Question:   Preferred imaging location?    Answer:   [facility]    Order Specific Question:   Radiology Contrast Protocol - do NOT remove file path    Answer:   [email or web address]  . CBC with Differential/Platelet    Standing Status:   Future    Standing Expiration Date:   08/07/2020  . Comprehensive metabolic panel    Standing Status:   Future    Standing Expiration Date:   08/07/2020  . Lactate dehydrogenase    Standing Status:   Future    Standing Expiration Date:   08/07/2020       Zoila Shutter MD

## 2018-08-15 DIAGNOSIS — I10 Essential (primary) hypertension: Secondary | ICD-10-CM | POA: Diagnosis not present

## 2018-08-15 DIAGNOSIS — R21 Rash and other nonspecific skin eruption: Secondary | ICD-10-CM | POA: Diagnosis not present

## 2018-08-15 DIAGNOSIS — I714 Abdominal aortic aneurysm, without rupture: Secondary | ICD-10-CM | POA: Diagnosis not present

## 2018-08-15 DIAGNOSIS — Z299 Encounter for prophylactic measures, unspecified: Secondary | ICD-10-CM | POA: Diagnosis not present

## 2018-08-15 DIAGNOSIS — Z23 Encounter for immunization: Secondary | ICD-10-CM | POA: Diagnosis not present

## 2018-08-15 DIAGNOSIS — Z6829 Body mass index (BMI) 29.0-29.9, adult: Secondary | ICD-10-CM | POA: Diagnosis not present

## 2018-08-15 DIAGNOSIS — N183 Chronic kidney disease, stage 3 (moderate): Secondary | ICD-10-CM | POA: Diagnosis not present

## 2018-08-18 DIAGNOSIS — H5200 Hypermetropia, unspecified eye: Secondary | ICD-10-CM | POA: Diagnosis not present

## 2018-08-18 DIAGNOSIS — H25813 Combined forms of age-related cataract, bilateral: Secondary | ICD-10-CM | POA: Diagnosis not present

## 2018-08-18 DIAGNOSIS — I10 Essential (primary) hypertension: Secondary | ICD-10-CM | POA: Diagnosis not present

## 2018-08-21 ENCOUNTER — Other Ambulatory Visit (HOSPITAL_COMMUNITY): Payer: Self-pay | Admitting: Internal Medicine

## 2018-08-21 ENCOUNTER — Telehealth (HOSPITAL_COMMUNITY): Payer: Self-pay | Admitting: Internal Medicine

## 2018-08-21 DIAGNOSIS — F17200 Nicotine dependence, unspecified, uncomplicated: Secondary | ICD-10-CM

## 2018-08-21 DIAGNOSIS — R059 Cough, unspecified: Secondary | ICD-10-CM

## 2018-08-21 DIAGNOSIS — R05 Cough: Secondary | ICD-10-CM

## 2018-08-21 NOTE — Telephone Encounter (Signed)
Ins denied CT scan due to pt not having prior chest xray. Per Dr Walden Field ok to cx scan and order xray

## 2018-08-22 ENCOUNTER — Ambulatory Visit (HOSPITAL_COMMUNITY)
Admission: RE | Admit: 2018-08-22 | Discharge: 2018-08-22 | Disposition: A | Discharge status: Home / Self Care | Payer: Medicare HMO | Source: Ambulatory Visit | Attending: Internal Medicine | Admitting: Internal Medicine

## 2018-08-22 DIAGNOSIS — R0602 Shortness of breath: Secondary | ICD-10-CM | POA: Diagnosis not present

## 2018-08-22 DIAGNOSIS — I700 Atherosclerosis of aorta: Secondary | ICD-10-CM | POA: Diagnosis not present

## 2018-08-22 DIAGNOSIS — R05 Cough: Secondary | ICD-10-CM | POA: Insufficient documentation

## 2018-08-22 DIAGNOSIS — F17200 Nicotine dependence, unspecified, uncomplicated: Secondary | ICD-10-CM | POA: Insufficient documentation

## 2018-08-22 DIAGNOSIS — R69 Illness, unspecified: Secondary | ICD-10-CM | POA: Diagnosis not present

## 2018-08-22 DIAGNOSIS — R918 Other nonspecific abnormal finding of lung field: Secondary | ICD-10-CM | POA: Diagnosis not present

## 2018-08-22 DIAGNOSIS — R059 Cough, unspecified: Secondary | ICD-10-CM

## 2018-08-25 ENCOUNTER — Ambulatory Visit (HOSPITAL_COMMUNITY): Payer: Medicare HMO

## 2018-09-06 ENCOUNTER — Telehealth (HOSPITAL_COMMUNITY): Payer: Self-pay | Admitting: Internal Medicine

## 2018-09-06 NOTE — Telephone Encounter (Signed)
Submitted appeal for denied CT CHEST to [employer].

## 2018-09-11 ENCOUNTER — Ambulatory Visit (HOSPITAL_COMMUNITY)
Admission: RE | Admit: 2018-09-11 | Discharge: 2018-09-11 | Disposition: A | Discharge status: Home / Self Care | Payer: Medicare HMO | Source: Ambulatory Visit | Attending: Internal Medicine | Admitting: Internal Medicine

## 2018-09-11 DIAGNOSIS — J439 Emphysema, unspecified: Secondary | ICD-10-CM | POA: Diagnosis not present

## 2018-09-11 DIAGNOSIS — R059 Cough, unspecified: Secondary | ICD-10-CM

## 2018-09-11 DIAGNOSIS — R0602 Shortness of breath: Secondary | ICD-10-CM | POA: Diagnosis not present

## 2018-09-11 DIAGNOSIS — I700 Atherosclerosis of aorta: Secondary | ICD-10-CM | POA: Insufficient documentation

## 2018-09-11 DIAGNOSIS — R599 Enlarged lymph nodes, unspecified: Secondary | ICD-10-CM | POA: Insufficient documentation

## 2018-09-11 DIAGNOSIS — K760 Fatty (change of) liver, not elsewhere classified: Secondary | ICD-10-CM | POA: Insufficient documentation

## 2018-09-11 DIAGNOSIS — Z720 Tobacco use: Secondary | ICD-10-CM | POA: Diagnosis present

## 2018-09-11 DIAGNOSIS — R05 Cough: Secondary | ICD-10-CM | POA: Diagnosis not present

## 2018-09-11 MED ORDER — IOPAMIDOL (ISOVUE-300) INJECTION 61%
75.0000 mL | Freq: Once | INTRAVENOUS | Status: AC | PRN
  Administered 2018-09-11: 75 mL via INTRAVENOUS

## 2018-09-13 DIAGNOSIS — H109 Unspecified conjunctivitis: Secondary | ICD-10-CM | POA: Diagnosis not present

## 2018-09-13 DIAGNOSIS — M797 Fibromyalgia: Secondary | ICD-10-CM | POA: Diagnosis not present

## 2018-09-13 DIAGNOSIS — Z6829 Body mass index (BMI) 29.0-29.9, adult: Secondary | ICD-10-CM | POA: Diagnosis not present

## 2018-09-13 DIAGNOSIS — N183 Chronic kidney disease, stage 3 (moderate): Secondary | ICD-10-CM | POA: Diagnosis not present

## 2018-09-13 DIAGNOSIS — I10 Essential (primary) hypertension: Secondary | ICD-10-CM | POA: Diagnosis not present

## 2018-09-13 DIAGNOSIS — Z299 Encounter for prophylactic measures, unspecified: Secondary | ICD-10-CM | POA: Diagnosis not present

## 2018-09-21 DIAGNOSIS — L570 Actinic keratosis: Secondary | ICD-10-CM | POA: Diagnosis not present

## 2018-09-21 DIAGNOSIS — D239 Other benign neoplasm of skin, unspecified: Secondary | ICD-10-CM | POA: Diagnosis not present

## 2018-09-21 DIAGNOSIS — L821 Other seborrheic keratosis: Secondary | ICD-10-CM | POA: Diagnosis not present

## 2018-09-27 DIAGNOSIS — D2310 Other benign neoplasm of skin of unspecified eyelid, including canthus: Secondary | ICD-10-CM | POA: Diagnosis not present

## 2018-09-27 DIAGNOSIS — D485 Neoplasm of uncertain behavior of skin: Secondary | ICD-10-CM | POA: Diagnosis not present

## 2018-12-11 DIAGNOSIS — Z79899 Other long term (current) drug therapy: Secondary | ICD-10-CM | POA: Diagnosis not present

## 2018-12-11 DIAGNOSIS — I10 Essential (primary) hypertension: Secondary | ICD-10-CM | POA: Diagnosis not present

## 2018-12-11 DIAGNOSIS — E7800 Pure hypercholesterolemia, unspecified: Secondary | ICD-10-CM | POA: Diagnosis not present

## 2018-12-11 DIAGNOSIS — Z0000 Encounter for general adult medical examination without abnormal findings: Secondary | ICD-10-CM | POA: Diagnosis not present

## 2018-12-11 DIAGNOSIS — Z1211 Encounter for screening for malignant neoplasm of colon: Secondary | ICD-10-CM | POA: Diagnosis not present

## 2018-12-11 DIAGNOSIS — E559 Vitamin D deficiency, unspecified: Secondary | ICD-10-CM | POA: Diagnosis not present

## 2018-12-11 DIAGNOSIS — Z299 Encounter for prophylactic measures, unspecified: Secondary | ICD-10-CM | POA: Diagnosis not present

## 2018-12-11 DIAGNOSIS — R5383 Other fatigue: Secondary | ICD-10-CM | POA: Diagnosis not present

## 2018-12-11 DIAGNOSIS — Z6829 Body mass index (BMI) 29.0-29.9, adult: Secondary | ICD-10-CM | POA: Diagnosis not present

## 2018-12-11 DIAGNOSIS — I714 Abdominal aortic aneurysm, without rupture: Secondary | ICD-10-CM | POA: Diagnosis not present

## 2018-12-11 DIAGNOSIS — Z7189 Other specified counseling: Secondary | ICD-10-CM | POA: Diagnosis not present

## 2018-12-11 DIAGNOSIS — Z1339 Encounter for screening examination for other mental health and behavioral disorders: Secondary | ICD-10-CM | POA: Diagnosis not present

## 2018-12-11 DIAGNOSIS — Z1331 Encounter for screening for depression: Secondary | ICD-10-CM | POA: Diagnosis not present

## 2018-12-12 DIAGNOSIS — Z299 Encounter for prophylactic measures, unspecified: Secondary | ICD-10-CM | POA: Diagnosis not present

## 2018-12-12 DIAGNOSIS — N182 Chronic kidney disease, stage 2 (mild): Secondary | ICD-10-CM | POA: Diagnosis not present

## 2018-12-12 DIAGNOSIS — I10 Essential (primary) hypertension: Secondary | ICD-10-CM | POA: Diagnosis not present

## 2018-12-12 DIAGNOSIS — Z6829 Body mass index (BMI) 29.0-29.9, adult: Secondary | ICD-10-CM | POA: Diagnosis not present

## 2018-12-12 DIAGNOSIS — R69 Illness, unspecified: Secondary | ICD-10-CM | POA: Diagnosis not present

## 2018-12-12 DIAGNOSIS — E785 Hyperlipidemia, unspecified: Secondary | ICD-10-CM | POA: Diagnosis not present

## 2018-12-20 ENCOUNTER — Other Ambulatory Visit (HOSPITAL_COMMUNITY): Payer: Self-pay | Admitting: Nurse Practitioner

## 2018-12-20 DIAGNOSIS — I714 Abdominal aortic aneurysm, without rupture: Secondary | ICD-10-CM | POA: Diagnosis not present

## 2018-12-26 DIAGNOSIS — E2839 Other primary ovarian failure: Secondary | ICD-10-CM | POA: Diagnosis not present

## 2018-12-28 DIAGNOSIS — I719 Aortic aneurysm of unspecified site, without rupture: Secondary | ICD-10-CM | POA: Diagnosis not present

## 2018-12-28 DIAGNOSIS — Z299 Encounter for prophylactic measures, unspecified: Secondary | ICD-10-CM | POA: Diagnosis not present

## 2018-12-28 DIAGNOSIS — I10 Essential (primary) hypertension: Secondary | ICD-10-CM | POA: Diagnosis not present

## 2018-12-28 DIAGNOSIS — Z6829 Body mass index (BMI) 29.0-29.9, adult: Secondary | ICD-10-CM | POA: Diagnosis not present

## 2019-01-01 DIAGNOSIS — H25813 Combined forms of age-related cataract, bilateral: Secondary | ICD-10-CM | POA: Diagnosis not present

## 2019-01-22 ENCOUNTER — Ambulatory Visit: Payer: Medicare HMO | Admitting: Vascular Surgery

## 2019-01-22 ENCOUNTER — Encounter: Payer: Self-pay | Admitting: Vascular Surgery

## 2019-01-22 VITALS — BP 151/72 | HR 67 | Temp 96.9°F | Resp 20 | Wt 172.2 lb

## 2019-01-22 DIAGNOSIS — I714 Abdominal aortic aneurysm, without rupture, unspecified: Secondary | ICD-10-CM

## 2019-01-22 NOTE — Progress Notes (Signed)
Vascular and Vein Specialist of [facility] office  Patient name: Kelli Deleon MRN: [id number] DOB: 1946/03/04 Sex: female  REASON FOR CONSULT: Evaluation of asymptomatic abdominal aortic aneurysm  HPI: Kelli Deleon is a 73 y.o. female, who is in today for discussion of infrarenal abdominal aortic aneurysm that she had initial discovery of this several years ago when she was undergoing a CT scan for abdominal discomfort.  I have reviewed her CT scan from May 2017.  At that time she had a 3.7 cm infrarenal aneurysm.  She has had serial ultrasounds.  Most recently she underwent a ultrasound at [facility] showing a 4.5 cm aneurysm.  She is here for further discussion.  She has no symptoms referable to this.  No history of cardiac disease.  She is hypertensive.  She quit smoking 1 year ago.  No family history of aneurysm.x  Past Medical History:  Diagnosis Date  . GERD (gastroesophageal reflux disease)   . High cholesterol   . Hypertension     Family History  Problem Relation Age of Onset  . Cancer Mother        lung  . Tuberculosis Mother   . Tuberculosis Father   . Stroke Father   . COPD Sister   . Hypertension Sister   . Anxiety disorder Sister   . Hypertension Sister   . Heart disease Brother   . Hypertension Brother   . Atrial fibrillation Brother   . Neuropathy Brother   . Cancer Brother   . Cancer Brother   . Cancer Brother   . Kidney disease Brother   . Lung disease Brother   . Heart attack Brother   . Pneumonia Brother   . Heart attack Brother     SOCIAL HISTORY: Social History   Socioeconomic History  . Marital status: Married    Spouse name: Not on file  . Number of children: Not on file  . Years of education: Not on file  . Highest education level: Not on file  Occupational History  . Not on file  Social Needs  . Financial resource strain: Not on file  . Food insecurity:    Worry: Not on file     Inability: Not on file  . Transportation needs:    Medical: Not on file    Non-medical: Not on file  Tobacco Use  . Smoking status: Former Smoker    Packs/day: 2.00    Years: 60.00    Pack years: 120.00    Types: Cigarettes    Last attempt to quit: 01/13/2018    Years since quitting: 1.0  . Smokeless tobacco: Never Used  Substance and Sexual Activity  . Alcohol use: No    Frequency: Never  . Drug use: No  . Sexual activity: Not on file  Lifestyle  . Physical activity:    Days per week: Not on file    Minutes per session: Not on file  . Stress: Not on file  Relationships  . Social connections:    Talks on phone: Not on file    Gets together: Not on file    Attends religious service: Not on file    Active member of club or organization: Not on file    Attends meetings of clubs or organizations: Not on file    Relationship status: Not on file  . Intimate partner violence:    Fear of current or ex partner: Not on file    Emotionally abused:  Not on file    Physically abused: Not on file    Forced sexual activity: Not on file  Other Topics Concern  . Not on file  Social History Narrative  . Not on file    Allergies  Allergen Reactions  . Hydrocodone-Acetaminophen Hives and Itching  . Carbamazepine Other (See Comments)    Fatigue, intolerance, trigeminal neuralgia  . Gabapentin Other (See Comments)    Headache, nausea and vomiting, itching  . Lisinopril Other (See Comments)    Cough, aching, musculoskeletal  . Morphine Nausea And Vomiting  . Pregabalin Swelling    Current Outpatient Medications  Medication Sig Dispense Refill  . amLODipine (NORVASC) 5 MG tablet     . aspirin EC 81 MG tablet Take by mouth.    . Cholecalciferol (VITAMIN D3) 1000 units CAPS Take 1 capsule by mouth daily.    . DULoxetine (CYMBALTA) 30 MG capsule     . Multiple Vitamins-Minerals (CENTRUM SILVER 50+WOMEN PO) Take 1 tablet by mouth daily.    . pantoprazole (PROTONIX) 40 MG tablet       . pseudoephedrine (SUDAFED) 30 MG tablet Take 30 mg by mouth every 4 (four) hours as needed for congestion.    . rosuvastatin (CRESTOR) 10 MG tablet     . traMADol (ULTRAM) 50 MG tablet      No current facility-administered medications for this visit.     REVIEW OF SYSTEMS:  [X]  denotes positive finding, [ ]  denotes negative finding Cardiac  Comments:  Chest pain or chest pressure: x   Shortness of breath upon exertion: x   Short of breath when lying flat:    Irregular heart rhythm:        Vascular    Pain in calf, thigh, or hip brought on by ambulation: x   Pain in feet at night that wakes you up from your sleep:  x   Blood clot in your veins:    Leg swelling:         Pulmonary    Oxygen at home:    Productive cough:     Wheezing:         Neurologic    Sudden weakness in arms or legs:     Sudden numbness in arms or legs:     Sudden onset of difficulty speaking or slurred speech:    Temporary loss of vision in one eye:     Problems with dizziness:  x       Gastrointestinal    Blood in stool:     Vomited blood:         Genitourinary    Burning when urinating:     Blood in urine:        Psychiatric    Major depression:         Hematologic    Bleeding problems:    Problems with blood clotting too easily:        Skin    Rashes or ulcers:        Constitutional    Fever or chills:      PHYSICAL EXAM: There were no vitals filed for this visit.  GENERAL: The patient is a well-nourished female, in no acute distress. The vital signs are documented above. CARDIOVASCULAR: Carotid arteries are without bruits bilaterally.  2+ radial, 2+ femoral, 2+ popliteal and 2+ dorsalis pedis pulses with no evidence of peripheral aneurysm. PULMONARY: There is good air exchange  ABDOMEN: Soft and non-tender with moderate obesity.  I  do not palpate an aneurysm MUSCULOSKELETAL: There are no major deformities or cyanosis. NEUROLOGIC: No focal weakness or paresthesias are  detected. SKIN: There are no ulcers or rashes noted. PSYCHIATRIC: The patient has a normal affect.  DATA:  CT images were reviewed from May 2017.  At that time she did have a 3.7 cm aneurysm with a long infrarenal neck.  She would be a candidate for stent grafting based on study from 3 years ago.  MEDICAL ISSUES: Had long discussion with the patient regarding the significance of the aneurysm.  I have recommended that we see her in 6 months with repeat CT scan.  Explained that recommendation would be for consideration of elective repair should she reach 5 cm in diameter.  Explained that she is at extremely low risk for rupture currently but that this is not a 0 risk.  Reviewed symptoms of leaking aneurysm with her and she knows to call 911 to report immediately to the emergency room should this occur.  I discussed both open and stent graft repair of aneurysms if she reaches a size that it is appropriate for this.  She does not have rapid expansion with increased from 3.7 cm in 2017 to 4.5 cm currently.  We will see her again in 6 months   Rosetta Posner, MD [facility] Vascular and Vein Specialists of [facility] Tel [phone] Pager [phone]

## 2019-01-30 ENCOUNTER — Encounter (HOSPITAL_COMMUNITY): Payer: Self-pay

## 2019-01-30 ENCOUNTER — Other Ambulatory Visit: Payer: Self-pay

## 2019-01-31 ENCOUNTER — Encounter (HOSPITAL_COMMUNITY)
Admission: RE | Admit: 2019-01-31 | Discharge: 2019-01-31 | Disposition: A | Discharge status: Home / Self Care | Payer: Medicare HMO | Source: Ambulatory Visit | Attending: Ophthalmology | Admitting: Ophthalmology

## 2019-02-01 ENCOUNTER — Other Ambulatory Visit (HOSPITAL_COMMUNITY): Payer: Medicare HMO

## 2019-02-05 ENCOUNTER — Ambulatory Visit (HOSPITAL_COMMUNITY): Admission: RE | Admit: 2019-02-05 | Payer: Medicare HMO | Source: Ambulatory Visit | Admitting: Ophthalmology

## 2019-02-05 ENCOUNTER — Encounter (HOSPITAL_COMMUNITY): Admission: RE | Payer: Self-pay | Source: Ambulatory Visit

## 2019-02-05 SURGERY — PHACOEMULSIFICATION, CATARACT, WITH IOL INSERTION
Anesthesia: Monitor Anesthesia Care | Laterality: Right

## 2019-02-08 ENCOUNTER — Ambulatory Visit (HOSPITAL_COMMUNITY): Payer: Medicare HMO | Admitting: Hematology

## 2019-02-19 ENCOUNTER — Other Ambulatory Visit (HOSPITAL_COMMUNITY): Payer: Medicare HMO

## 2019-02-26 ENCOUNTER — Ambulatory Visit: Admit: 2019-02-26 | Payer: Medicare HMO | Admitting: Ophthalmology

## 2019-02-26 SURGERY — PHACOEMULSIFICATION, CATARACT, WITH IOL INSERTION
Anesthesia: Monitor Anesthesia Care | Laterality: Left

## 2019-04-12 DIAGNOSIS — Z79899 Other long term (current) drug therapy: Secondary | ICD-10-CM | POA: Diagnosis not present

## 2019-04-12 DIAGNOSIS — I10 Essential (primary) hypertension: Secondary | ICD-10-CM | POA: Diagnosis not present

## 2019-04-12 DIAGNOSIS — N183 Chronic kidney disease, stage 3 (moderate): Secondary | ICD-10-CM | POA: Diagnosis not present

## 2019-04-12 DIAGNOSIS — I714 Abdominal aortic aneurysm, without rupture: Secondary | ICD-10-CM | POA: Diagnosis not present

## 2019-04-12 DIAGNOSIS — M797 Fibromyalgia: Secondary | ICD-10-CM | POA: Diagnosis not present

## 2019-04-12 DIAGNOSIS — Z299 Encounter for prophylactic measures, unspecified: Secondary | ICD-10-CM | POA: Diagnosis not present

## 2019-04-18 ENCOUNTER — Encounter (HOSPITAL_COMMUNITY)
Admission: RE | Admit: 2019-04-18 | Discharge: 2019-04-18 | Disposition: A | Discharge status: Home / Self Care | Payer: Medicare HMO | Source: Ambulatory Visit | Attending: Ophthalmology | Admitting: Ophthalmology

## 2019-04-19 ENCOUNTER — Other Ambulatory Visit (HOSPITAL_COMMUNITY): Payer: Medicare HMO

## 2019-06-13 NOTE — H&P (Signed)
Surgical History & Physical  Patient Name: Kelli Deleon DOB: July 31, 1946  Surgery: Cataract extraction with intraocular lens implant phacoemulsification; Right Eye  Surgeon: Baruch Goldmann MD Surgery Date:  06/22/2019 Pre-Op Date:  05/30/2019  HPI: A 76 Yr. old female patient 1. 1. The patient complains of difficulty when viewing TV, reading closed caption, news scrolls on TV. Both eyes are affected. The episode is constant. The condition's severity increased since last visit. Symptoms occur when the patient is driving and reading. This is This is negatively affecting the patient's quality of life.  Medical History: Cataracts  GERD High Blood Pressure LDL  Review of Systems Negative Allergic/Immunologic Negative Cardiovascular Negative Constitutional Negative Gastrointestinal Negative Genitourinary Negative Ear, Nose, Mouth & Throat Negative Neurological Negative Respiratory  Social   Current every day smoker    Medication TRAMADOL HCL TAB, AMLODIPINE TAB, UNKNOWN CHOLESTEROL MED, UNKNOWN STOMACH MEDICATION, SUDAFED,   Sx/Procedures  None  Drug Allergies  Vicodin,   History & Physical: Heent:  Cataract, Right eye NECK: supple without bruits LUNGS: lungs clear to auscultation CV: regular rate and rhythm Abdomen: soft and non-tender  Impression & Plan: Assessment: 1.  COMBINED FORMS AGE RELATED CATARACT; Both Eyes (H25.813)  Plan: 1.  Cataract accounts for the patient's decreased vision. This visual impairment is not correctable with a tolerable change in glasses or contact lenses. Cataract surgery with an implantation of a new lens should significantly improve the visual and functional status of the patient. Discussed all risks, benefits, alternatives, and potential complications. Discussed the procedures and recovery. Patient desires to have surgery. A-scan ordered and performed today for intra-ocular lens calculations. The surgery will be performed in order to  improve vision for driving, reading, and for eye examinations. Recommend phacoemulsification with intra-ocular lens. Right Eye. Dilates well - shugarcaine by protocol.

## 2019-06-15 DIAGNOSIS — H25811 Combined forms of age-related cataract, right eye: Secondary | ICD-10-CM | POA: Diagnosis not present

## 2019-06-19 ENCOUNTER — Encounter (HOSPITAL_COMMUNITY)
Admission: RE | Admit: 2019-06-19 | Discharge: 2019-06-19 | Disposition: A | Discharge status: Home / Self Care | Payer: Medicare HMO | Source: Ambulatory Visit | Attending: Ophthalmology | Admitting: Ophthalmology

## 2019-06-19 ENCOUNTER — Encounter (HOSPITAL_COMMUNITY): Payer: Self-pay

## 2019-06-19 ENCOUNTER — Other Ambulatory Visit (HOSPITAL_COMMUNITY)
Admission: RE | Admit: 2019-06-19 | Discharge: 2019-06-19 | Disposition: A | Discharge status: Home / Self Care | Payer: Medicare HMO | Source: Ambulatory Visit | Attending: Ophthalmology | Admitting: Ophthalmology

## 2019-06-19 ENCOUNTER — Other Ambulatory Visit: Payer: Self-pay

## 2019-06-19 DIAGNOSIS — Z01812 Encounter for preprocedural laboratory examination: Secondary | ICD-10-CM | POA: Insufficient documentation

## 2019-06-19 DIAGNOSIS — Z20828 Contact with and (suspected) exposure to other viral communicable diseases: Secondary | ICD-10-CM | POA: Insufficient documentation

## 2019-06-19 LAB — SARS CORONAVIRUS 2 (TAT 6-24 HRS): SARS Coronavirus 2: NEGATIVE

## 2019-06-22 ENCOUNTER — Ambulatory Visit (HOSPITAL_COMMUNITY): Payer: Medicare HMO | Admitting: Anesthesiology

## 2019-06-22 ENCOUNTER — Ambulatory Visit (HOSPITAL_COMMUNITY)
Admission: RE | Admit: 2019-06-22 | Discharge: 2019-06-22 | Disposition: A | Discharge status: Home / Self Care | Payer: Medicare HMO | Attending: Ophthalmology | Admitting: Ophthalmology

## 2019-06-22 ENCOUNTER — Encounter (HOSPITAL_COMMUNITY)
Admission: RE | Disposition: A | Discharge status: Home / Self Care | Payer: Self-pay | Source: Home / Self Care | Attending: Ophthalmology

## 2019-06-22 DIAGNOSIS — F17200 Nicotine dependence, unspecified, uncomplicated: Secondary | ICD-10-CM | POA: Insufficient documentation

## 2019-06-22 DIAGNOSIS — K219 Gastro-esophageal reflux disease without esophagitis: Secondary | ICD-10-CM | POA: Diagnosis not present

## 2019-06-22 DIAGNOSIS — H25811 Combined forms of age-related cataract, right eye: Secondary | ICD-10-CM | POA: Diagnosis not present

## 2019-06-22 DIAGNOSIS — H25813 Combined forms of age-related cataract, bilateral: Secondary | ICD-10-CM | POA: Insufficient documentation

## 2019-06-22 DIAGNOSIS — R69 Illness, unspecified: Secondary | ICD-10-CM | POA: Diagnosis not present

## 2019-06-22 HISTORY — PX: CATARACT EXTRACTION W/PHACO: SHX586

## 2019-06-22 SURGERY — PHACOEMULSIFICATION, CATARACT, WITH IOL INSERTION
Anesthesia: Monitor Anesthesia Care | Site: Eye | Laterality: Right

## 2019-06-22 MED ORDER — NEOMYCIN-POLYMYXIN-DEXAMETH 3.5-10000-0.1 OP SUSP
OPHTHALMIC | Status: DC | PRN
  Administered 2019-06-22: 2 [drp] via OPHTHALMIC

## 2019-06-22 MED ORDER — MIDAZOLAM HCL 2 MG/2ML IJ SOLN
INTRAMUSCULAR | Status: DC | PRN
  Administered 2019-06-22: 2 mg via INTRAVENOUS

## 2019-06-22 MED ORDER — EPINEPHRINE PF 1 MG/ML IJ SOLN
INTRAOCULAR | Status: DC | PRN
  Administered 2019-06-22: 10:00:00 500 mL

## 2019-06-22 MED ORDER — PROVISC 10 MG/ML IO SOLN
INTRAOCULAR | Status: DC | PRN
  Administered 2019-06-22: 0.85 mL via INTRAOCULAR

## 2019-06-22 MED ORDER — POVIDONE-IODINE 5 % OP SOLN
OPHTHALMIC | Status: DC | PRN
  Administered 2019-06-22: 1 via OPHTHALMIC

## 2019-06-22 MED ORDER — TETRACAINE HCL 0.5 % OP SOLN
1.0000 [drp] | OPHTHALMIC | Status: AC
  Administered 2019-06-22 (×3): 1 [drp] via OPHTHALMIC

## 2019-06-22 MED ORDER — SODIUM CHLORIDE 0.9% FLUSH
10.0000 mL | INTRAVENOUS | Status: DC | PRN
  Administered 2019-06-22: 09:00:00 3 mL via INTRAVENOUS

## 2019-06-22 MED ORDER — LIDOCAINE HCL (PF) 1 % IJ SOLN
INTRAOCULAR | Status: DC | PRN
  Administered 2019-06-22: 10:00:00 1 mL via OPHTHALMIC

## 2019-06-22 MED ORDER — PHENYLEPHRINE HCL 2.5 % OP SOLN
1.0000 [drp] | OPHTHALMIC | Status: AC
  Administered 2019-06-22 (×3): 1 [drp] via OPHTHALMIC

## 2019-06-22 MED ORDER — MIDAZOLAM HCL 2 MG/2ML IJ SOLN
INTRAMUSCULAR | Status: AC
  Filled 2019-06-22: qty 2

## 2019-06-22 MED ORDER — LIDOCAINE HCL 3.5 % OP GEL
1.0000 "application " | Freq: Once | OPHTHALMIC | Status: AC
  Administered 2019-06-22: 1 via OPHTHALMIC

## 2019-06-22 MED ORDER — SODIUM HYALURONATE 23 MG/ML IO SOLN
INTRAOCULAR | Status: DC | PRN
  Administered 2019-06-22: 0.6 mL via INTRAOCULAR

## 2019-06-22 MED ORDER — CYCLOPENTOLATE-PHENYLEPHRINE 0.2-1 % OP SOLN
1.0000 [drp] | OPHTHALMIC | Status: AC
  Administered 2019-06-22 (×3): 1 [drp] via OPHTHALMIC

## 2019-06-22 MED ORDER — BSS IO SOLN
INTRAOCULAR | Status: DC | PRN
  Administered 2019-06-22: 15 mL via INTRAOCULAR

## 2019-06-22 SURGICAL SUPPLY — 13 items

## 2019-06-22 NOTE — Interval H&P Note (Signed)
History and Physical Interval Note: The H and P was reviewed and updated. The patient was examined.  No changes were found after exam.  The surgical eye was marked.  06/22/2019 9:22 AM  Kelli Deleon  has presented today for surgery, with the diagnosis of nuclear cataract right eye.  The various methods of treatment have been discussed with the patient and family. After consideration of risks, benefits and other options for treatment, the patient has consented to  Procedure(s) with comments: CATARACT EXTRACTION PHACO AND INTRAOCULAR LENS PLACEMENT (IOC) (Right) - right as a surgical intervention.  [name] history has been reviewed, patient examined, no change in status, stable for surgery.  I have reviewed the patient's chart and labs.  Questions were answered to the patient's satisfaction.     Baruch Goldmann

## 2019-06-22 NOTE — Op Note (Signed)
Date of procedure: 06/22/19  Pre-operative diagnosis: Visually significant age-related cataract, Right Eye (H25.811)  Post-operative diagnosis: Visually significant age-related cataract, Right Eye  Procedure: Removal of cataract via phacoemulsification and insertion of intra-ocular lens [name] and [employer] PCB00  +24.5D into the capsular bag of the Right Eye  Attending surgeon: Gerda Diss. Natalee Tomkiewicz, MD, MA  Anesthesia: MAC, Topical Akten  Complications: None  Estimated Blood Loss: <7m (minimal)  Specimens: None  Implants: As above  Indications:  Visually significant age-related cataract, Right Eye  Procedure:  The patient was seen and identified in the pre-operative area. The operative eye was identified and dilated.  The operative eye was marked.  Topical anesthesia was administered to the operative eye.     The patient was then to the operative suite and placed in the supine position.  A timeout was performed confirming the patient, procedure to be performed, and all other relevant information.   [name] face was prepped and draped in the usual fashion for intra-ocular surgery.  A lid speculum was placed into the operative eye and the surgical microscope moved into place and focused.  A superotemporal paracentesis was created using a 20 gauge paracentesis blade.  Shugarcaine was injected into the anterior chamber.  Viscoelastic was injected into the anterior chamber.  A temporal clear-corneal main wound incision was created using a 2.428mmicrokeratome.  A continuous curvilinear capsulorrhexis was initiated using an irrigating cystitome and completed using capsulorrhexis forceps.  Hydrodissection and hydrodeliniation were performed.  Viscoelastic was injected into the anterior chamber.  A phacoemulsification handpiece and a chopper as a second instrument were used to remove the nucleus and epinucleus. The irrigation/aspiration handpiece was used to remove any remaining cortical  material.   The capsular bag was reinflated with viscoelastic, checked, and found to be intact.  The intraocular lens was inserted into the capsular bag and dialed into place using a Kuglen hook.  The irrigation/aspiration handpiece was used to remove any remaining viscoelastic.  The clear corneal wound and paracentesis wounds were then hydrated and checked with Weck-Cels to be watertight.  The lid-speculum and drape was removed, and the patient's face was cleaned with a wet and dry 4x4.  Maxitrol was instilled in the eye before a clear shield was taped over the eye. The patient was taken to the post-operative care unit in good condition, having tolerated the procedure well.  Post-Op Instructions: [facility] and will receive all other orders and instructions.

## 2019-06-22 NOTE — Transfer of Care (Signed)
Immediate Anesthesia Transfer of Care Note  Patient: Kelli Deleon  Procedure(s) Performed: CATARACT EXTRACTION PHACO AND INTRAOCULAR LENS PLACEMENT (IOC) (Right Eye)  Patient Location: Short Stay  Anesthesia Type:MAC  Level of Consciousness: awake, alert  and patient cooperative  Airway & Oxygen Therapy: Patient Spontanous Breathing  Post-op Assessment: Report given to RN and Post -op Vital signs reviewed and stable  Post vital signs: Reviewed and stable  Last Vitals:  Vitals Value Taken Time  BP 149/74   Temp 97.7   Pulse 59   Resp 18   SpO2 96     Last Pain:  Vitals:   06/22/19 0859  TempSrc: Oral  PainSc: 6       Patients Stated Pain Goal: 5 (33/29/51 8841)  Complications: No apparent anesthesia complications

## 2019-06-22 NOTE — Anesthesia Preprocedure Evaluation (Addendum)
Anesthesia Evaluation  Patient identified by MRN, date of birth, ID band Patient awake    Reviewed: Allergy & Precautions, NPO status , Patient's Chart, lab work & pertinent test results  Airway Mallampati: II       Dental  (+) Lower Dentures, Upper Dentures   Pulmonary Current SmokerPatient did not abstain from smoking.,    Pulmonary exam normal        Cardiovascular METS: 3 - Mets hypertension, Pt. on medications  Rhythm:Regular Rate:Normal     Neuro/Psych negative neurological ROS     GI/Hepatic Neg liver ROS, GERD  Medicated,  Endo/Other  negative endocrine ROS  Renal/GU negative Renal ROS     Musculoskeletal  (+) Fibromyalgia -  Abdominal   Peds  Hematology negative hematology ROS (+)   Anesthesia Other Findings   Reproductive/Obstetrics                            Anesthesia Physical Anesthesia Plan  ASA: III  Anesthesia Plan: MAC   Post-op Pain Management:    Induction:   PONV Risk Score and Plan:   Airway Management Planned: Nasal Cannula  Additional Equipment:   Intra-op Plan:   Post-operative Plan:   Informed Consent: I have reviewed the patients History and Physical, chart, labs and discussed the procedure including the risks, benefits and alternatives for the proposed anesthesia with the patient or authorized representative who has indicated his/her understanding and acceptance.     Dental advisory given  Plan Discussed with: CRNA  Anesthesia Plan Comments:         Anesthesia Quick Evaluation

## 2019-06-22 NOTE — Anesthesia Postprocedure Evaluation (Signed)
Anesthesia Post Note  Patient: Kelli Deleon  Procedure(s) Performed: CATARACT EXTRACTION PHACO AND INTRAOCULAR LENS PLACEMENT (IOC) (Right Eye)  Patient location during evaluation: Short Stay Anesthesia Type: MAC Level of consciousness: awake and alert and patient cooperative Pain management: pain level controlled Vital Signs Assessment: post-procedure vital signs reviewed and stable Respiratory status: spontaneous breathing Cardiovascular status: stable Postop Assessment: no apparent nausea or vomiting Anesthetic complications: no     Last Vitals:  Vitals:   06/22/19 0859 06/22/19 0951  BP: (!) 153/70 (!) 149/74  Pulse: 62 62  Resp: (!) 21 16  Temp: 36.6 C 36.5 C  SpO2: 96% 96%    Last Pain:  Vitals:   06/22/19 0951  TempSrc: Oral  PainSc: 0-No pain                 Coline Calkin

## 2019-06-22 NOTE — Discharge Instructions (Signed)
Please discharge patient when stable, will follow up today with Dr. Lafreda Casebeer at the [facility] office immediately following discharge.  Leave shield in place until visit.  All paperwork with discharge instructions will be given at the office. ° °

## 2019-06-22 NOTE — Anesthesia Procedure Notes (Signed)
Procedure Name: MAC Date/Time: 06/22/2019 9:25 AM Performed by: Vista Deck, CRNA Pre-anesthesia Checklist: Patient identified, Emergency Drugs available, Suction available, Timeout performed and Patient being monitored Patient Re-evaluated:Patient Re-evaluated prior to induction Oxygen Delivery Method: Nasal Cannula

## 2019-06-25 ENCOUNTER — Encounter (HOSPITAL_COMMUNITY): Payer: Self-pay | Admitting: Ophthalmology

## 2019-06-29 DIAGNOSIS — H25812 Combined forms of age-related cataract, left eye: Secondary | ICD-10-CM | POA: Diagnosis not present

## 2019-06-29 NOTE — H&P (Signed)
Surgical History & Physical  Patient Name: Kelli Deleon DOB: 13-Aug-1946  Surgery: Cataract extraction with intraocular lens implant phacoemulsification; Left Eye  Surgeon: Baruch Goldmann MD Surgery Date:  07/06/2019 Pre-Op Date:  06/28/2019  HPI: A 23 Yr. old female patient 1. The patient is returning after cataract surgery. The right eye is affected. Status post cataract surgery: Onset was S/P PC IOL 06/22/19. Since the last visit, the affected area is doing well. [name] vision is improved. Patient is following medication instructions 3 in 1 TID as directed. Patient ready to proceed with OS due to difference between OU. OU not working together. Patient closes OS to see better at times. The vision in the left eye is very blurry. This is negatively affecting the patient's quality of life. HPI was performed by Baruch Goldmann .  Medical History: Cataracts GERD High Blood Pressure LDL  Review of Systems Respiratory productive cough All recorded systems are negative except as noted above.  Social   Current every day smoker  Medication Prednisolon-gatiflox-bromfenac,  TRAMADOL HCL TAB, AMLODIPINE TAB, UNKNOWN CHOLESTEROL MED, UNKNOWN STOMACH MEDICATION, SUDAFED,   Sx/Procedures Phaco c IOL,   Drug Allergies  Vicodin,   History & Physical: Heent:  Cataract, Left eye NECK: supple without bruits LUNGS: lungs clear to auscultation CV: regular rate and rhythm Abdomen: soft and non-tender  Impression & Plan: Assessment: 1.  CATARACT EXTRACTION STATUS; Right Eye (Z98.41) 2.  COMBINED FORMS AGE RELATED CATARACT; , Left Eye (H25.812)  Plan: 1.  1 week after cataract surgery. Doing well with improved vision and normal eye pressure. Call with any problems or concerns. Continue Gati-Brom-Pred 2x/day for 3 more weeks. 2.  Consider symfony lens. Cataract accounts for the patient's decreased vision. This visual impairment is not correctable with a tolerable change in glasses or  contact lenses. Cataract surgery with an implantation of a new lens should significantly improve the visual and functional status of the patient. Discussed all risks, benefits, alternatives, and potential complications. Discussed the procedures and recovery. Patient desires to have surgery. A-scan ordered and performed today for intra-ocular lens calculations. The surgery will be performed in order to improve vision for driving, reading, and for eye examinations. Recommend phacoemulsification with intra-ocular lens. Left Eye. Surgery required to correct imbalance of vision. Dilates well - shugarcaine by protocol.

## 2019-07-03 ENCOUNTER — Encounter (HOSPITAL_COMMUNITY): Payer: Self-pay

## 2019-07-03 ENCOUNTER — Encounter (HOSPITAL_COMMUNITY)
Admission: RE | Admit: 2019-07-03 | Discharge: 2019-07-03 | Disposition: A | Discharge status: Home / Self Care | Payer: Medicare HMO | Source: Ambulatory Visit | Attending: Ophthalmology | Admitting: Ophthalmology

## 2019-07-03 ENCOUNTER — Other Ambulatory Visit (HOSPITAL_COMMUNITY)
Admission: RE | Admit: 2019-07-03 | Discharge: 2019-07-03 | Disposition: A | Discharge status: Home / Self Care | Payer: Medicare HMO | Source: Ambulatory Visit | Attending: Ophthalmology | Admitting: Ophthalmology

## 2019-07-03 ENCOUNTER — Other Ambulatory Visit: Payer: Self-pay

## 2019-07-03 DIAGNOSIS — Z01812 Encounter for preprocedural laboratory examination: Secondary | ICD-10-CM | POA: Insufficient documentation

## 2019-07-03 DIAGNOSIS — Z20828 Contact with and (suspected) exposure to other viral communicable diseases: Secondary | ICD-10-CM | POA: Insufficient documentation

## 2019-07-03 LAB — SARS CORONAVIRUS 2 (TAT 6-24 HRS): SARS Coronavirus 2: NEGATIVE

## 2019-07-06 ENCOUNTER — Other Ambulatory Visit: Payer: Self-pay

## 2019-07-06 ENCOUNTER — Ambulatory Visit (HOSPITAL_COMMUNITY)
Admission: RE | Admit: 2019-07-06 | Discharge: 2019-07-06 | Disposition: A | Discharge status: Home / Self Care | Payer: Medicare HMO | Source: Ambulatory Visit | Attending: Ophthalmology | Admitting: Ophthalmology

## 2019-07-06 ENCOUNTER — Encounter (HOSPITAL_COMMUNITY)
Admission: RE | Disposition: A | Discharge status: Home / Self Care | Payer: Self-pay | Source: Ambulatory Visit | Attending: Ophthalmology

## 2019-07-06 ENCOUNTER — Ambulatory Visit (HOSPITAL_COMMUNITY): Payer: Medicare HMO | Admitting: Anesthesiology

## 2019-07-06 ENCOUNTER — Encounter (HOSPITAL_COMMUNITY): Payer: Self-pay | Admitting: *Deleted

## 2019-07-06 DIAGNOSIS — I10 Essential (primary) hypertension: Secondary | ICD-10-CM | POA: Diagnosis not present

## 2019-07-06 DIAGNOSIS — K219 Gastro-esophageal reflux disease without esophagitis: Secondary | ICD-10-CM | POA: Insufficient documentation

## 2019-07-06 DIAGNOSIS — Z79899 Other long term (current) drug therapy: Secondary | ICD-10-CM | POA: Diagnosis not present

## 2019-07-06 DIAGNOSIS — F17200 Nicotine dependence, unspecified, uncomplicated: Secondary | ICD-10-CM | POA: Insufficient documentation

## 2019-07-06 DIAGNOSIS — Z885 Allergy status to narcotic agent status: Secondary | ICD-10-CM | POA: Diagnosis not present

## 2019-07-06 DIAGNOSIS — H2512 Age-related nuclear cataract, left eye: Secondary | ICD-10-CM | POA: Diagnosis not present

## 2019-07-06 DIAGNOSIS — E785 Hyperlipidemia, unspecified: Secondary | ICD-10-CM | POA: Insufficient documentation

## 2019-07-06 DIAGNOSIS — R69 Illness, unspecified: Secondary | ICD-10-CM | POA: Diagnosis not present

## 2019-07-06 DIAGNOSIS — H25812 Combined forms of age-related cataract, left eye: Secondary | ICD-10-CM | POA: Diagnosis not present

## 2019-07-06 HISTORY — PX: CATARACT EXTRACTION W/PHACO: SHX586

## 2019-07-06 SURGERY — PHACOEMULSIFICATION, CATARACT, WITH IOL INSERTION
Anesthesia: Monitor Anesthesia Care | Site: Eye | Laterality: Left

## 2019-07-06 MED ORDER — BSS IO SOLN
INTRAOCULAR | Status: DC | PRN
  Administered 2019-07-06: 15 mL

## 2019-07-06 MED ORDER — MIDAZOLAM HCL 2 MG/2ML IJ SOLN: 0.5000 mg | Freq: Once | INTRAMUSCULAR | Status: DC | PRN

## 2019-07-06 MED ORDER — LIDOCAINE HCL 3.5 % OP GEL
1.0000 "application " | Freq: Once | OPHTHALMIC | Status: AC
  Administered 2019-07-06: 1 via OPHTHALMIC

## 2019-07-06 MED ORDER — MIDAZOLAM HCL 2 MG/2ML IJ SOLN
INTRAMUSCULAR | Status: DC | PRN
  Administered 2019-07-06: 2 mg via INTRAVENOUS

## 2019-07-06 MED ORDER — PROMETHAZINE HCL 25 MG/ML IJ SOLN: 6.2500 mg | INTRAMUSCULAR | Status: DC | PRN

## 2019-07-06 MED ORDER — PROVISC 10 MG/ML IO SOLN
INTRAOCULAR | Status: DC | PRN
  Administered 2019-07-06: 0.85 mL via INTRAOCULAR

## 2019-07-06 MED ORDER — SODIUM HYALURONATE 23 MG/ML IO SOLN
INTRAOCULAR | Status: DC | PRN
  Administered 2019-07-06: 0.6 mL via INTRAOCULAR

## 2019-07-06 MED ORDER — POVIDONE-IODINE 5 % OP SOLN
OPHTHALMIC | Status: DC | PRN
  Administered 2019-07-06: 1 via OPHTHALMIC

## 2019-07-06 MED ORDER — TETRACAINE HCL 0.5 % OP SOLN
1.0000 [drp] | OPHTHALMIC | Status: AC | PRN
  Administered 2019-07-06 (×3): 1 [drp] via OPHTHALMIC

## 2019-07-06 MED ORDER — PHENYLEPHRINE HCL 2.5 % OP SOLN
1.0000 [drp] | OPHTHALMIC | Status: AC | PRN
  Administered 2019-07-06 (×3): 1 [drp] via OPHTHALMIC

## 2019-07-06 MED ORDER — LIDOCAINE HCL (PF) 1 % IJ SOLN
INTRAOCULAR | Status: DC | PRN
  Administered 2019-07-06: 11:00:00 1 mL via OPHTHALMIC

## 2019-07-06 MED ORDER — SODIUM CHLORIDE 0.9% FLUSH
10.0000 mL | INTRAVENOUS | Status: DC | PRN
  Administered 2019-07-06: 11:00:00 10 mL via INTRAVENOUS
  Filled 2019-07-06: qty 10

## 2019-07-06 MED ORDER — MIDAZOLAM HCL 2 MG/2ML IJ SOLN
INTRAMUSCULAR | Status: AC
  Filled 2019-07-06: qty 2

## 2019-07-06 MED ORDER — EPINEPHRINE PF 1 MG/ML IJ SOLN
INTRAOCULAR | Status: DC | PRN
  Administered 2019-07-06: 11:00:00 500 mL

## 2019-07-06 MED ORDER — NEOMYCIN-POLYMYXIN-DEXAMETH 3.5-10000-0.1 OP SUSP
OPHTHALMIC | Status: DC | PRN
  Administered 2019-07-06: 1 [drp] via OPHTHALMIC

## 2019-07-06 MED ORDER — CYCLOPENTOLATE-PHENYLEPHRINE 0.2-1 % OP SOLN
1.0000 [drp] | OPHTHALMIC | Status: AC | PRN
  Administered 2019-07-06 (×3): 1 [drp] via OPHTHALMIC

## 2019-07-06 SURGICAL SUPPLY — 14 items
CLOTH BEACON ORANGE TIMEOUT ST (SAFETY) ×3 IMPLANT
EYE SHIELD UNIVERSAL CLEAR (GAUZE/BANDAGES/DRESSINGS) ×3 IMPLANT
GLOVE BIOGEL PI IND STRL 6.5 (GLOVE) ×1 IMPLANT
GLOVE BIOGEL PI IND STRL 7.0 (GLOVE) ×1 IMPLANT
GLOVE BIOGEL PI INDICATOR 6.5 (GLOVE) ×2
GLOVE BIOGEL PI INDICATOR 7.0 (GLOVE) ×2
LENS ALC ACRYL/TECN (Ophthalmic Related) ×3 IMPLANT
NEEDLE HYPO 18GX1.5 BLUNT FILL (NEEDLE) ×3 IMPLANT
PAD ARMBOARD 7.5X6 YLW CONV (MISCELLANEOUS) ×3 IMPLANT
SYR TB 1ML LL NO SAFETY (SYRINGE) ×3 IMPLANT
TAPE SURG TRANSPORE 1 IN (GAUZE/BANDAGES/DRESSINGS) ×1 IMPLANT
TAPE SURGICAL TRANSPORE 1 IN (GAUZE/BANDAGES/DRESSINGS) ×2
VISCOELASTIC ADDITIONAL (OPHTHALMIC RELATED) ×3 IMPLANT
WATER STERILE IRR 250ML POUR (IV SOLUTION) ×3 IMPLANT

## 2019-07-06 NOTE — Anesthesia Preprocedure Evaluation (Signed)
Anesthesia Evaluation  Patient identified by MRN, date of birth, ID band Patient awake    Reviewed: Allergy & Precautions, NPO status , Patient's Chart, lab work & pertinent test results  History of Anesthesia Complications (+) PONV  Airway Mallampati: II  TM Distance: >3 FB Neck ROM: Full    Dental no notable dental hx. (+) Teeth Intact   Pulmonary neg pulmonary ROS, Patient did not abstain from smoking., former smoker,    Pulmonary exam normal breath sounds clear to auscultation       Cardiovascular Exercise Tolerance: Good hypertension, Pt. on medications negative cardio ROS Normal cardiovascular examI Rhythm:Regular Rate:Normal     Neuro/Psych negative neurological ROS  negative psych ROS   GI/Hepatic Neg liver ROS, GERD  Medicated and Controlled,  Endo/Other  negative endocrine ROS  Renal/GU negative Renal ROS  negative genitourinary   Musculoskeletal negative musculoskeletal ROS (+)   Abdominal   Peds negative pediatric ROS (+)  Hematology negative hematology ROS (+)   Anesthesia Other Findings   Reproductive/Obstetrics negative OB ROS                             Anesthesia Physical Anesthesia Plan  ASA: II  Anesthesia Plan: MAC   Post-op Pain Management:    Induction: Intravenous  PONV Risk Score and Plan: 3 and Treatment may vary due to age or medical condition, TIVA and Ondansetron  Airway Management Planned: Nasal Cannula  Additional Equipment:   Intra-op Plan:   Post-operative Plan:   Informed Consent: I have reviewed the patients History and Physical, chart, labs and discussed the procedure including the risks, benefits and alternatives for the proposed anesthesia with the patient or authorized representative who has indicated his/her understanding and acceptance.     Dental advisory given  Plan Discussed with: CRNA  Anesthesia Plan Comments: (Plan  Full PPE use  Plan MAC d/w pt -WTP with same after Q&A Reports no issues with other eye on 06/22/19)        Anesthesia Quick Evaluation

## 2019-07-06 NOTE — Discharge Instructions (Addendum)
Please discharge patient when stable, will follow up today with Dr. Wrzosek at the [facility] office immediately following discharge.  Leave shield in place until visit.  All paperwork with discharge instructions will be given at the office. ° ° ° ° °Monitored Anesthesia Care, Care After °These instructions provide you with information about caring for yourself after your procedure. Your health care provider may also give you more specific instructions. Your treatment has been planned according to current medical practices, but problems sometimes occur. Call your health care provider if you have any problems or questions after your procedure. °What can I expect after the procedure? °After your procedure, you may: °· Feel sleepy for several hours. °· Feel clumsy and have poor balance for several hours. °· Feel forgetful about what happened after the procedure. °· Have poor judgment for several hours. °· Feel nauseous or vomit. °· Have a sore throat if you had a breathing tube during the procedure. °Follow these instructions at home: °For at least 24 hours after the procedure: ° °  ° °· Have a responsible adult stay with you. It is important to have someone help care for you until you are awake and alert. °· Rest as needed. °· Do not: °? Participate in activities in which you could fall or become injured. °? Drive. °? Use heavy machinery. °? Drink alcohol. °? Take sleeping pills or medicines that cause drowsiness. °? Make important decisions or sign legal documents. °? Take care of children on your own. °Eating and drinking °· Follow the diet that is recommended by your health care provider. °· If you vomit, drink water, juice, or soup when you can drink without vomiting. °· Make sure you have little or no nausea before eating solid foods. °General instructions °· Take over-the-counter and prescription medicines only as told by your health care provider. °· If you have sleep apnea, surgery and certain  medicines can increase your risk for breathing problems. Follow instructions from your health care provider about wearing your sleep device: °? Anytime you are sleeping, including during daytime naps. °? While taking prescription pain medicines, sleeping medicines, or medicines that make you drowsy. °· If you smoke, do not smoke without supervision. °· Keep all follow-up visits as told by your health care provider. This is important. °Contact a health care provider if: °· You keep feeling nauseous or you keep vomiting. °· You feel light-headed. °· You develop a rash. °· You have a fever. °Get help right away if: °· You have trouble breathing. °Summary °· For several hours after your procedure, you may feel sleepy and have poor judgment. °· Have a responsible adult stay with you for at least 24 hours or until you are awake and alert. °This information is not intended to replace advice given to you by your health care provider. Make sure you discuss any questions you have with your health care provider. °Document Released: 02/22/2016 Document Revised: 01/30/2018 Document Reviewed: 02/22/2016 °Elsevier Patient Education © 2020 [employer] ° °

## 2019-07-06 NOTE — Op Note (Signed)
Date of procedure: 07/06/19  Pre-operative diagnosis: Visually significant age-related cataract, Left Eye (H25.?2)  Post-operative diagnosis: Visually significant age-related cataract, Left Eye  Procedure: Removal of cataract via phacoemulsification and insertion of intra-ocular lens [name] and [employer] PCB00  +D into the capsular bag of the Left Eye  Attending surgeon: Gerda Diss. Enis Leatherwood, MD, MA  Anesthesia: MAC, Topical Akten  Complications: None  Estimated Blood Loss: <20m (minimal)  Specimens: None  Implants: As above  Indications:  Visually significant age-related cataract, Left Eye  Procedure:  The patient was seen and identified in the pre-operative area. The operative eye was identified and dilated.  The operative eye was marked.  Topical anesthesia was administered to the operative eye.     The patient was then to the operative suite and placed in the supine position.  A timeout was performed confirming the patient, procedure to be performed, and all other relevant information.   [name] face was prepped and draped in the usual fashion for intra-ocular surgery.  A lid speculum was placed into the operative eye and the surgical microscope moved into place and focused.  An inferotemporal paracentesis was created using a 20 gauge paracentesis blade.  Shugarcaine was injected into the anterior chamber.  Viscoelastic was injected into the anterior chamber.  A temporal clear-corneal main wound incision was created using a 2.489mmicrokeratome.  A continuous curvilinear capsulorrhexis was initiated using an irrigating cystitome and completed using capsulorrhexis forceps.  Hydrodissection and hydrodeliniation were performed.  Viscoelastic was injected into the anterior chamber.  A phacoemulsification handpiece and a chopper as a second instrument were used to remove the nucleus and epinucleus. The irrigation/aspiration handpiece was used to remove any remaining cortical material.    The capsular bag was reinflated with viscoelastic, checked, and found to be intact.  The intraocular lens was inserted into the capsular bag.  The irrigation/aspiration handpiece was used to remove any remaining viscoelastic.  The clear corneal wound and paracentesis wounds were then hydrated and checked with Weck-Cels to be watertight.  The lid-speculum and drape was removed, and the patient's face was cleaned with a wet and dry 4x4.  Maxitrol was instilled in the eye before a clear shield was taped over the eye. The patient was taken to the post-operative care unit in good condition, having tolerated the procedure well.  Post-Op Instructions: [facility] and will receive all other orders and instructions.

## 2019-07-06 NOTE — Anesthesia Procedure Notes (Signed)
Procedure Name: MAC Date/Time: 07/06/2019 11:02 AM Performed by: Vista Deck, CRNA Pre-anesthesia Checklist: Patient identified, Emergency Drugs available, Suction available, Timeout performed and Patient being monitored Patient Re-evaluated:Patient Re-evaluated prior to induction Oxygen Delivery Method: Nasal Cannula

## 2019-07-06 NOTE — Interval H&P Note (Signed)
History and Physical Interval Note: The H and P was reviewed and updated. The patient was examined.  No changes were found after exam.  The surgical eye was marked.  07/06/2019 11:02 AM  Kelli Deleon  has presented today for surgery, with the diagnosis of Nuclear sclerotic cataract - Left eye.  The various methods of treatment have been discussed with the patient and family. After consideration of risks, benefits and other options for treatment, the patient has consented to  Procedure(s) with comments: CATARACT EXTRACTION PHACO AND INTRAOCULAR LENS PLACEMENT ([place]) (Left) - left as a surgical intervention.  [name] history has been reviewed, patient examined, no change in status, stable for surgery.  I have reviewed the patient's chart and labs.  Questions were answered to the patient's satisfaction.     Baruch Goldmann

## 2019-07-06 NOTE — Anesthesia Postprocedure Evaluation (Signed)
Anesthesia Post Note  Patient: Kelli Deleon  Procedure(s) Performed: CATARACT EXTRACTION PHACO AND INTRAOCULAR LENS PLACEMENT ([place]) (Left Eye)  Patient location during evaluation: Short Stay Anesthesia Type: MAC Level of consciousness: awake and alert Pain management: pain level controlled Vital Signs Assessment: post-procedure vital signs reviewed and stable Respiratory status: spontaneous breathing Cardiovascular status: stable Postop Assessment: no apparent nausea or vomiting Anesthetic complications: no     Last Vitals:  Vitals:   07/06/19 1011 07/06/19 1127  BP: (!) 147/63 (!) 145/70  Pulse:  60  Resp: 17 18  Temp: 36.6 C 36.6 C  SpO2: 98% 94%    Last Pain:  Vitals:   07/06/19 1127  TempSrc: Oral  PainSc: 2                  Rajni Holsworth

## 2019-07-06 NOTE — Transfer of Care (Signed)
Immediate Anesthesia Transfer of Care Note  Patient: Kelli Deleon  Procedure(s) Performed: CATARACT EXTRACTION PHACO AND INTRAOCULAR LENS PLACEMENT ([facility]) (Left Eye)  Patient Location: Short Stay  Anesthesia Type:MAC  Level of Consciousness: awake, alert  and patient cooperative  Airway & Oxygen Therapy: Patient Spontanous Breathing  Post-op Assessment: Report given to RN and Post -op Vital signs reviewed and stable  Post vital signs: Reviewed and stable  Last Vitals:  Vitals Value Taken Time  BP    Temp    Pulse    Resp    SpO2     SEE PACU FLOW SHEETS FOR VITAL SIGNS Last Pain:  Vitals:   07/06/19 1011  TempSrc: Oral  PainSc: 7       Patients Stated Pain Goal: 9 (58/85/02 7741)  Complications: No apparent anesthesia complications

## 2019-07-09 ENCOUNTER — Encounter (HOSPITAL_COMMUNITY): Payer: Self-pay | Admitting: Ophthalmology

## 2019-07-16 ENCOUNTER — Other Ambulatory Visit: Payer: Self-pay | Admitting: Vascular Surgery

## 2019-07-16 DIAGNOSIS — I714 Abdominal aortic aneurysm, without rupture, unspecified: Secondary | ICD-10-CM

## 2019-07-26 ENCOUNTER — Other Ambulatory Visit: Payer: Self-pay | Admitting: Vascular Surgery

## 2019-07-26 DIAGNOSIS — I714 Abdominal aortic aneurysm, without rupture, unspecified: Secondary | ICD-10-CM

## 2019-08-07 DIAGNOSIS — I714 Abdominal aortic aneurysm, without rupture: Secondary | ICD-10-CM | POA: Diagnosis not present

## 2019-08-14 ENCOUNTER — Other Ambulatory Visit: Payer: Self-pay

## 2019-08-14 ENCOUNTER — Ambulatory Visit (INDEPENDENT_AMBULATORY_CARE_PROVIDER_SITE_OTHER): Payer: Medicare HMO | Admitting: Vascular Surgery

## 2019-08-14 ENCOUNTER — Encounter: Payer: Self-pay | Admitting: Vascular Surgery

## 2019-08-14 VITALS — BP 157/82 | Wt 170.0 lb

## 2019-08-14 DIAGNOSIS — I714 Abdominal aortic aneurysm, without rupture, unspecified: Secondary | ICD-10-CM

## 2019-08-14 NOTE — Progress Notes (Signed)
Virtual Visit via Telephone Note    I connected with Kelli Deleon on 08/14/2019 using the [employer] by telephone and verified that I was speaking with the correct person using two identifiers. Patient was located at home and accompanied by no one. I am located at [facility]   The limitations of evaluation and management by telemedicine and the availability of in person appointments have been previously discussed with the patient and are documented in the patients chart. The patient expressed understanding and consented to proceed.  PCP: Berenice Primas   Chief Complaint: Donnell aortic aneurysm  History of Present Illness: Kelli Deleon is a 73 y.o. female with known history of abdominal aortic aneurysm.  She underwent a CT scan last week and I am discussing this with her by telephone.  Has no symptoms referable to her aneurysm and no new medical difficulties  Past Medical History:  Diagnosis Date  . GERD (gastroesophageal reflux disease)   . High cholesterol   . Hypertension     Past Surgical History:  Procedure Laterality Date  . ABDOMINAL HYSTERECTOMY    . CATARACT EXTRACTION W/PHACO Right 06/22/2019   Procedure: CATARACT EXTRACTION PHACO AND INTRAOCULAR LENS PLACEMENT ([facility]);  Surgeon: Baruch Goldmann, MD;  Location: [facility];  Service: Ophthalmology;  Laterality: Right;  CDE: 6.30  . CATARACT EXTRACTION W/PHACO Left 07/06/2019   Procedure: CATARACT EXTRACTION PHACO AND INTRAOCULAR LENS PLACEMENT ([facility]);  Surgeon: Baruch Goldmann, MD;  Location: [facility];  Service: Ophthalmology;  Laterality: Left;  left, CDE: 4.88  . STAPEDES SURGERY Bilateral    1975 and #2 in 1990's    Current Meds  Medication Sig  . amLODipine (NORVASC) 5 MG tablet Take 5 mg by mouth every evening.   Marland Kitchen aspirin EC 81 MG tablet Take 81-162 mg by mouth daily as needed (chest pain).   . Cholecalciferol (VITAMIN D3) 1000 units CAPS Take 1,000 Units by mouth daily.   . DULoxetine (CYMBALTA) 30 MG capsule Take  30 mg by mouth every evening.   . Multiple Vitamins-Minerals (CENTRUM SILVER 50+WOMEN PO) Take 1 tablet by mouth daily.  . pantoprazole (PROTONIX) 40 MG tablet Take 40 mg by mouth daily as needed (acid reflux).   . phenylephrine (SUDAFED PE) 10 MG TABS tablet Take 10 mg by mouth every 6 (six) hours as needed (sinus headaches).  . rosuvastatin (CRESTOR) 10 MG tablet Take 10 mg by mouth every evening.   . traMADol (ULTRAM) 50 MG tablet Take 50 mg by mouth every 6 (six) hours as needed for moderate pain.     12 system ROS was negative unless otherwise noted in HPI   Observations/Objective:  CT scan from 08/07/2019 was reviewed.  This shows maximal diameter of 4.2 cm of her infrarenal aorta.  The most recent CT scan from May 2017 showed a maximal diameter of 3.7 cm. Assessment and Plan:  Stable infrarenal abdominal otic aneurysm with slow growth over several years.  Recommend ultrasound in 1 year for continued follow-up and we will coordinate this.  Explained symptoms of leaking aneurysm but did explain that this would be extremely unlikely at her current level of 4.2 Follow Up Instructions:  1 year in our office with ultrasound of her aorta   I discussed the assessment and treatment plan with the patient. The patient was provided an opportunity to ask questions and all were answered. The patient agreed with the plan and demonstrated an understanding of the instructions.   The patient was advised  to call back or seek an in-person evaluation if the symptoms worsen or if the condition fails to improve as anticipated.  I spent 5-10 minutes with the patient via telephone encounter.   Annamary Rummage [facility] [place] Office: [phone]  08/14/2019, 10:35 AM

## 2019-08-16 DIAGNOSIS — Z79899 Other long term (current) drug therapy: Secondary | ICD-10-CM | POA: Diagnosis not present

## 2019-08-16 DIAGNOSIS — I714 Abdominal aortic aneurysm, without rupture: Secondary | ICD-10-CM | POA: Diagnosis not present

## 2019-08-16 DIAGNOSIS — I10 Essential (primary) hypertension: Secondary | ICD-10-CM | POA: Diagnosis not present

## 2019-08-16 DIAGNOSIS — Z299 Encounter for prophylactic measures, unspecified: Secondary | ICD-10-CM | POA: Diagnosis not present

## 2019-08-16 DIAGNOSIS — K219 Gastro-esophageal reflux disease without esophagitis: Secondary | ICD-10-CM | POA: Diagnosis not present

## 2019-08-16 DIAGNOSIS — M797 Fibromyalgia: Secondary | ICD-10-CM | POA: Diagnosis not present

## 2019-08-16 DIAGNOSIS — Z6829 Body mass index (BMI) 29.0-29.9, adult: Secondary | ICD-10-CM | POA: Diagnosis not present

## 2019-09-18 DIAGNOSIS — Z299 Encounter for prophylactic measures, unspecified: Secondary | ICD-10-CM | POA: Diagnosis not present

## 2019-09-18 DIAGNOSIS — K219 Gastro-esophageal reflux disease without esophagitis: Secondary | ICD-10-CM | POA: Diagnosis not present

## 2019-09-18 DIAGNOSIS — I10 Essential (primary) hypertension: Secondary | ICD-10-CM | POA: Diagnosis not present

## 2019-09-18 DIAGNOSIS — I714 Abdominal aortic aneurysm, without rupture: Secondary | ICD-10-CM | POA: Diagnosis not present

## 2019-09-18 DIAGNOSIS — N189 Chronic kidney disease, unspecified: Secondary | ICD-10-CM | POA: Diagnosis not present

## 2019-09-18 DIAGNOSIS — Z6829 Body mass index (BMI) 29.0-29.9, adult: Secondary | ICD-10-CM | POA: Diagnosis not present

## 2019-09-18 DIAGNOSIS — R69 Illness, unspecified: Secondary | ICD-10-CM | POA: Diagnosis not present

## 2019-10-04 DIAGNOSIS — R69 Illness, unspecified: Secondary | ICD-10-CM | POA: Diagnosis not present

## 2019-12-04 DIAGNOSIS — Z1231 Encounter for screening mammogram for malignant neoplasm of breast: Secondary | ICD-10-CM | POA: Diagnosis not present

## 2019-12-18 DIAGNOSIS — Z1211 Encounter for screening for malignant neoplasm of colon: Secondary | ICD-10-CM | POA: Diagnosis not present

## 2019-12-18 DIAGNOSIS — N183 Chronic kidney disease, stage 3 unspecified: Secondary | ICD-10-CM | POA: Diagnosis not present

## 2019-12-18 DIAGNOSIS — R5383 Other fatigue: Secondary | ICD-10-CM | POA: Diagnosis not present

## 2019-12-18 DIAGNOSIS — Z6830 Body mass index (BMI) 30.0-30.9, adult: Secondary | ICD-10-CM | POA: Diagnosis not present

## 2019-12-18 DIAGNOSIS — Z1339 Encounter for screening examination for other mental health and behavioral disorders: Secondary | ICD-10-CM | POA: Diagnosis not present

## 2019-12-18 DIAGNOSIS — Z1331 Encounter for screening for depression: Secondary | ICD-10-CM | POA: Diagnosis not present

## 2019-12-18 DIAGNOSIS — Z7189 Other specified counseling: Secondary | ICD-10-CM | POA: Diagnosis not present

## 2019-12-18 DIAGNOSIS — Z0000 Encounter for general adult medical examination without abnormal findings: Secondary | ICD-10-CM | POA: Diagnosis not present

## 2019-12-18 DIAGNOSIS — I10 Essential (primary) hypertension: Secondary | ICD-10-CM | POA: Diagnosis not present

## 2019-12-18 DIAGNOSIS — E7800 Pure hypercholesterolemia, unspecified: Secondary | ICD-10-CM | POA: Diagnosis not present

## 2019-12-18 DIAGNOSIS — Z299 Encounter for prophylactic measures, unspecified: Secondary | ICD-10-CM | POA: Diagnosis not present

## 2019-12-18 DIAGNOSIS — R69 Illness, unspecified: Secondary | ICD-10-CM | POA: Diagnosis not present

## 2020-03-10 DIAGNOSIS — M797 Fibromyalgia: Secondary | ICD-10-CM | POA: Diagnosis not present

## 2020-03-10 DIAGNOSIS — R69 Illness, unspecified: Secondary | ICD-10-CM | POA: Diagnosis not present

## 2020-03-10 DIAGNOSIS — I10 Essential (primary) hypertension: Secondary | ICD-10-CM | POA: Diagnosis not present

## 2020-03-10 DIAGNOSIS — R1031 Right lower quadrant pain: Secondary | ICD-10-CM | POA: Diagnosis not present

## 2020-03-10 DIAGNOSIS — Z885 Allergy status to narcotic agent status: Secondary | ICD-10-CM | POA: Diagnosis not present

## 2020-03-10 DIAGNOSIS — Z79899 Other long term (current) drug therapy: Secondary | ICD-10-CM | POA: Diagnosis not present

## 2020-03-10 DIAGNOSIS — E7800 Pure hypercholesterolemia, unspecified: Secondary | ICD-10-CM | POA: Diagnosis not present

## 2020-03-10 DIAGNOSIS — Z7982 Long term (current) use of aspirin: Secondary | ICD-10-CM | POA: Diagnosis not present

## 2020-03-10 DIAGNOSIS — K760 Fatty (change of) liver, not elsewhere classified: Secondary | ICD-10-CM | POA: Diagnosis not present

## 2020-03-20 DIAGNOSIS — M797 Fibromyalgia: Secondary | ICD-10-CM | POA: Diagnosis not present

## 2020-03-20 DIAGNOSIS — N183 Chronic kidney disease, stage 3 unspecified: Secondary | ICD-10-CM | POA: Diagnosis not present

## 2020-03-20 DIAGNOSIS — Z79899 Other long term (current) drug therapy: Secondary | ICD-10-CM | POA: Diagnosis not present

## 2020-03-20 DIAGNOSIS — I10 Essential (primary) hypertension: Secondary | ICD-10-CM | POA: Diagnosis not present

## 2020-03-20 DIAGNOSIS — R69 Illness, unspecified: Secondary | ICD-10-CM | POA: Diagnosis not present

## 2020-03-20 DIAGNOSIS — I714 Abdominal aortic aneurysm, without rupture: Secondary | ICD-10-CM | POA: Diagnosis not present

## 2020-03-20 DIAGNOSIS — Z299 Encounter for prophylactic measures, unspecified: Secondary | ICD-10-CM | POA: Diagnosis not present

## 2020-04-30 DIAGNOSIS — R609 Edema, unspecified: Secondary | ICD-10-CM | POA: Diagnosis not present

## 2020-04-30 DIAGNOSIS — N183 Chronic kidney disease, stage 3 unspecified: Secondary | ICD-10-CM | POA: Diagnosis not present

## 2020-04-30 DIAGNOSIS — I719 Aortic aneurysm of unspecified site, without rupture: Secondary | ICD-10-CM | POA: Diagnosis not present

## 2020-04-30 DIAGNOSIS — Z299 Encounter for prophylactic measures, unspecified: Secondary | ICD-10-CM | POA: Diagnosis not present

## 2020-04-30 DIAGNOSIS — I10 Essential (primary) hypertension: Secondary | ICD-10-CM | POA: Diagnosis not present

## 2020-04-30 DIAGNOSIS — I714 Abdominal aortic aneurysm, without rupture: Secondary | ICD-10-CM | POA: Diagnosis not present

## 2020-04-30 DIAGNOSIS — Z6830 Body mass index (BMI) 30.0-30.9, adult: Secondary | ICD-10-CM | POA: Diagnosis not present

## 2020-05-09 DIAGNOSIS — R609 Edema, unspecified: Secondary | ICD-10-CM | POA: Diagnosis not present

## 2020-05-14 DIAGNOSIS — Z299 Encounter for prophylactic measures, unspecified: Secondary | ICD-10-CM | POA: Diagnosis not present

## 2020-05-14 DIAGNOSIS — J439 Emphysema, unspecified: Secondary | ICD-10-CM | POA: Diagnosis not present

## 2020-05-14 DIAGNOSIS — I714 Abdominal aortic aneurysm, without rupture: Secondary | ICD-10-CM | POA: Diagnosis not present

## 2020-05-14 DIAGNOSIS — I10 Essential (primary) hypertension: Secondary | ICD-10-CM | POA: Diagnosis not present

## 2020-05-14 DIAGNOSIS — R69 Illness, unspecified: Secondary | ICD-10-CM | POA: Diagnosis not present

## 2020-05-14 DIAGNOSIS — R609 Edema, unspecified: Secondary | ICD-10-CM | POA: Diagnosis not present

## 2020-08-19 DIAGNOSIS — M797 Fibromyalgia: Secondary | ICD-10-CM | POA: Diagnosis not present

## 2020-08-19 DIAGNOSIS — Z299 Encounter for prophylactic measures, unspecified: Secondary | ICD-10-CM | POA: Diagnosis not present

## 2020-08-19 DIAGNOSIS — I714 Abdominal aortic aneurysm, without rupture: Secondary | ICD-10-CM | POA: Diagnosis not present

## 2020-08-19 DIAGNOSIS — I10 Essential (primary) hypertension: Secondary | ICD-10-CM | POA: Diagnosis not present

## 2020-08-19 DIAGNOSIS — R69 Illness, unspecified: Secondary | ICD-10-CM | POA: Diagnosis not present

## 2020-08-19 DIAGNOSIS — J439 Emphysema, unspecified: Secondary | ICD-10-CM | POA: Diagnosis not present

## 2020-08-22 DIAGNOSIS — Z299 Encounter for prophylactic measures, unspecified: Secondary | ICD-10-CM | POA: Diagnosis not present

## 2020-08-22 DIAGNOSIS — I10 Essential (primary) hypertension: Secondary | ICD-10-CM | POA: Diagnosis not present

## 2020-08-22 DIAGNOSIS — M797 Fibromyalgia: Secondary | ICD-10-CM | POA: Diagnosis not present

## 2020-08-22 DIAGNOSIS — R69 Illness, unspecified: Secondary | ICD-10-CM | POA: Diagnosis not present

## 2020-08-22 DIAGNOSIS — Z6830 Body mass index (BMI) 30.0-30.9, adult: Secondary | ICD-10-CM | POA: Diagnosis not present

## 2020-08-22 DIAGNOSIS — I719 Aortic aneurysm of unspecified site, without rupture: Secondary | ICD-10-CM | POA: Diagnosis not present

## 2020-08-22 DIAGNOSIS — I714 Abdominal aortic aneurysm, without rupture: Secondary | ICD-10-CM | POA: Diagnosis not present

## 2020-08-22 DIAGNOSIS — N183 Chronic kidney disease, stage 3 unspecified: Secondary | ICD-10-CM | POA: Diagnosis not present

## 2020-09-19 DIAGNOSIS — I10 Essential (primary) hypertension: Secondary | ICD-10-CM | POA: Diagnosis not present

## 2020-09-19 DIAGNOSIS — R69 Illness, unspecified: Secondary | ICD-10-CM | POA: Diagnosis not present

## 2020-09-19 DIAGNOSIS — Z299 Encounter for prophylactic measures, unspecified: Secondary | ICD-10-CM | POA: Diagnosis not present

## 2020-09-19 DIAGNOSIS — M797 Fibromyalgia: Secondary | ICD-10-CM | POA: Diagnosis not present

## 2020-09-19 DIAGNOSIS — K219 Gastro-esophageal reflux disease without esophagitis: Secondary | ICD-10-CM | POA: Diagnosis not present

## 2020-10-13 ENCOUNTER — Ambulatory Visit: Payer: Medicare HMO | Admitting: Vascular Surgery

## 2020-10-13 ENCOUNTER — Other Ambulatory Visit: Payer: Self-pay

## 2020-10-13 ENCOUNTER — Encounter: Payer: Self-pay | Admitting: Vascular Surgery

## 2020-10-13 VITALS — BP 158/84 | HR 82 | Temp 98.1°F | Resp 18 | Ht 65.0 in | Wt 180.0 lb

## 2020-10-13 DIAGNOSIS — I714 Abdominal aortic aneurysm, without rupture, unspecified: Secondary | ICD-10-CM

## 2020-10-13 NOTE — Progress Notes (Signed)
[facility] [place]  Patient name: Kelli Deleon MRN: [id number] DOB: [birth year] Sex: female  REASON FOR VISIT: Follow-up known abdominal aortic aneurysm  HPI: Kelli Deleon is a 74 y.o. female here today for follow-up.  She has a known history of abdominal arctic aneurysm this is been followed with ultrasound and CT scan.  Her most recent CT scan was in September 2020 where her maximal diameter was 4.2 cm.  Prior to this in May 2017 CT revealed a 3.7 cm aneurysm.  She remains in good health with no new major medical difficulties.  Past Medical History:  Diagnosis Date  . GERD (gastroesophageal reflux disease)   . High cholesterol   . Hypertension     Family History  Problem Relation Age of Onset  . Cancer Mother        lung  . Tuberculosis Mother   . Tuberculosis Father   . Stroke Father   . COPD Sister   . Hypertension Sister   . Anxiety disorder Sister   . Hypertension Sister   . Heart disease Brother   . Hypertension Brother   . Atrial fibrillation Brother   . Neuropathy Brother   . Cancer Brother   . Cancer Brother   . Cancer Brother   . Kidney disease Brother   . Lung disease Brother   . Heart attack Brother   . Pneumonia Brother   . Heart attack Brother     SOCIAL HISTORY: Social History   Tobacco Use  . Smoking status: Current Every Day Smoker    Packs/day: 2.00    Years: 60.00    Pack years: 120.00    Types: Cigarettes  . Smokeless tobacco: Never Used  Substance Use Topics  . Alcohol use: No    Allergies  Allergen Reactions  . Hydrocodone-Acetaminophen Hives and Itching  . Carbamazepine Other (See Comments)    Fatigue, intolerance, trigeminal neuralgia  . Gabapentin Itching and Nausea And Vomiting    Headache  . Lisinopril Cough    aching, musculoskeletal  . Morphine Nausea And Vomiting  . Pregabalin Swelling    Current Outpatient Medications  Medication Sig Dispense Refill  .  amLODipine (NORVASC) 5 MG tablet Take 5 mg by mouth every evening.     Marland Kitchen aspirin EC 81 MG tablet Take 81-162 mg by mouth daily as needed (chest pain).     . Cholecalciferol (VITAMIN D3) 1000 units CAPS Take 1,000 Units by mouth daily.     . DULoxetine (CYMBALTA) 30 MG capsule Take 30 mg by mouth every evening.     . Multiple Vitamins-Minerals (CENTRUM SILVER 50+WOMEN PO) Take 1 tablet by mouth daily.    . pantoprazole (PROTONIX) 40 MG tablet Take 40 mg by mouth daily as needed (acid reflux).     . phenylephrine (SUDAFED PE) 10 MG TABS tablet Take 10 mg by mouth every 6 (six) hours as needed (sinus headaches).    . rosuvastatin (CRESTOR) 10 MG tablet Take 10 mg by mouth every evening.     . traMADol (ULTRAM) 50 MG tablet Take 50 mg by mouth every 6 (six) hours as needed for moderate pain.      No current facility-administered medications for this visit.    REVIEW OF SYSTEMS:  [X]  denotes positive finding, [ ]  denotes negative finding Cardiac  Comments:  Chest pain or chest pressure:    Shortness of breath upon exertion:    Short of breath when lying flat:  Irregular heart rhythm:        Vascular    Pain in calf, thigh, or hip brought on by ambulation:    Pain in feet at night that wakes you up from your sleep:     Blood clot in your veins:    Leg swelling:           PHYSICAL EXAM: Vitals:   10/13/20 1111  BP: (!) 158/84  Pulse: 82  Resp: 18  Temp: 98.1 F (36.7 C)  TempSrc: Other (Comment)  SpO2: 93%  Weight: 180 lb (81.6 kg)  Height: 5\' 5"  (1.651 m)    GENERAL: The patient is a well-nourished female, in no acute distress. The vital signs are documented above. CARDIOVASCULAR: 2+ radial 2+ femoral and 2+ popliteal pulses with no evidence of peripheral artery aneurysm.  Moderate obesity and I do not feel abdominal aneurysm or tenderness PULMONARY: There is good air exchange  MUSCULOSKELETAL: There are no major deformities or cyanosis. NEUROLOGIC: No focal weakness or  paresthesias are detected. SKIN: There are no ulcers or rashes noted. PSYCHIATRIC: The patient has a normal affect.  DATA:  Patient did not have ultrasound prior to my visit  MEDICAL ISSUES: Stable overall.  I did not have her ultrasound scheduled prior to this visit.  We will obtain this and I will contact her by telephone.  Assuming this is showing no rapid expansion, would reschedule her for 1 year with repeat ultrasound at that time.  We again reviewed symptoms of leaking aneurysm and she knows to report immediately to the emergency room should this occur    Rosetta Posner, MD [facility] Tel [phone]

## 2020-10-14 ENCOUNTER — Other Ambulatory Visit: Payer: Self-pay | Admitting: *Deleted

## 2020-10-14 DIAGNOSIS — I714 Abdominal aortic aneurysm, without rupture, unspecified: Secondary | ICD-10-CM

## 2020-10-24 ENCOUNTER — Other Ambulatory Visit: Payer: Self-pay

## 2020-10-24 ENCOUNTER — Ambulatory Visit (HOSPITAL_COMMUNITY)
Admission: RE | Admit: 2020-10-24 | Discharge: 2020-10-24 | Disposition: A | Discharge status: Home / Self Care | Payer: Medicare HMO | Source: Ambulatory Visit | Attending: Vascular Surgery | Admitting: Vascular Surgery

## 2020-10-24 DIAGNOSIS — I714 Abdominal aortic aneurysm, without rupture, unspecified: Secondary | ICD-10-CM

## 2020-11-03 ENCOUNTER — Telehealth: Payer: Self-pay | Admitting: Vascular Surgery

## 2020-11-03 NOTE — Telephone Encounter (Signed)
I spoke with the patient by telephone.  Our office had contacted her to let her know there had been some expansion of her aneurysm sac size and she is scheduled for CT abdomen and pelvis and office visit with me on 11/24/2020.  Discussed options for open and stent graft repair and will make decision based on her CT scan

## 2020-11-04 ENCOUNTER — Other Ambulatory Visit: Payer: Self-pay

## 2020-11-04 DIAGNOSIS — I714 Abdominal aortic aneurysm, without rupture, unspecified: Secondary | ICD-10-CM

## 2020-11-12 ENCOUNTER — Other Ambulatory Visit: Payer: Self-pay

## 2020-11-12 ENCOUNTER — Ambulatory Visit (HOSPITAL_COMMUNITY)
Admission: RE | Admit: 2020-11-12 | Discharge: 2020-11-12 | Disposition: A | Discharge status: Home / Self Care | Payer: Medicare HMO | Source: Ambulatory Visit | Attending: Vascular Surgery | Admitting: Vascular Surgery

## 2020-11-12 DIAGNOSIS — I714 Abdominal aortic aneurysm, without rupture, unspecified: Secondary | ICD-10-CM

## 2020-11-12 DIAGNOSIS — I701 Atherosclerosis of renal artery: Secondary | ICD-10-CM | POA: Diagnosis not present

## 2020-11-12 DIAGNOSIS — I712 Thoracic aortic aneurysm, without rupture: Secondary | ICD-10-CM | POA: Diagnosis not present

## 2020-11-12 DIAGNOSIS — I70203 Unspecified atherosclerosis of native arteries of extremities, bilateral legs: Secondary | ICD-10-CM | POA: Diagnosis not present

## 2020-11-12 MED ORDER — IOHEXOL 350 MG/ML SOLN
100.0000 mL | Freq: Once | INTRAVENOUS | Status: AC | PRN
  Administered 2020-11-12: 100 mL via INTRAVENOUS

## 2020-11-17 LAB — POCT I-STAT CREATININE: Creatinine, Ser: 1.2 mg/dL — ABNORMAL HIGH (ref 0.44–1.00)

## 2020-11-24 ENCOUNTER — Telehealth: Payer: Self-pay | Admitting: Vascular Surgery

## 2020-11-24 ENCOUNTER — Ambulatory Visit: Payer: Medicare HMO | Admitting: Vascular Surgery

## 2020-11-24 NOTE — Telephone Encounter (Signed)
I called the patient to discuss results of recent CT scan.  She has known infrarenal abdominal aortic aneurysm.  Ultrasound had suggested that this had increased from 4.2 to 5 cm.  She underwent CT scan for confirmation and further evaluation.  This showed that she had had no significant growth with the maximal diameter of her aneurysm at 4.3 cm.  I discussed this with her by telephone and we will see her in 1 year with ultrasound follow-up

## 2020-12-12 DIAGNOSIS — M797 Fibromyalgia: Secondary | ICD-10-CM | POA: Diagnosis not present

## 2020-12-12 DIAGNOSIS — I10 Essential (primary) hypertension: Secondary | ICD-10-CM | POA: Diagnosis not present

## 2020-12-12 DIAGNOSIS — Z6830 Body mass index (BMI) 30.0-30.9, adult: Secondary | ICD-10-CM | POA: Diagnosis not present

## 2020-12-12 DIAGNOSIS — Z299 Encounter for prophylactic measures, unspecified: Secondary | ICD-10-CM | POA: Diagnosis not present

## 2020-12-12 DIAGNOSIS — F17210 Nicotine dependence, cigarettes, uncomplicated: Secondary | ICD-10-CM | POA: Diagnosis not present

## 2020-12-12 DIAGNOSIS — J0110 Acute frontal sinusitis, unspecified: Secondary | ICD-10-CM | POA: Diagnosis not present

## 2020-12-12 DIAGNOSIS — R69 Illness, unspecified: Secondary | ICD-10-CM | POA: Diagnosis not present

## 2020-12-23 DIAGNOSIS — R5383 Other fatigue: Secondary | ICD-10-CM | POA: Diagnosis not present

## 2020-12-23 DIAGNOSIS — R69 Illness, unspecified: Secondary | ICD-10-CM | POA: Diagnosis not present

## 2020-12-23 DIAGNOSIS — Z299 Encounter for prophylactic measures, unspecified: Secondary | ICD-10-CM | POA: Diagnosis not present

## 2020-12-23 DIAGNOSIS — Z1331 Encounter for screening for depression: Secondary | ICD-10-CM | POA: Diagnosis not present

## 2020-12-23 DIAGNOSIS — I10 Essential (primary) hypertension: Secondary | ICD-10-CM | POA: Diagnosis not present

## 2020-12-23 DIAGNOSIS — F17210 Nicotine dependence, cigarettes, uncomplicated: Secondary | ICD-10-CM | POA: Diagnosis not present

## 2020-12-23 DIAGNOSIS — Z7189 Other specified counseling: Secondary | ICD-10-CM | POA: Diagnosis not present

## 2020-12-23 DIAGNOSIS — N183 Chronic kidney disease, stage 3 unspecified: Secondary | ICD-10-CM | POA: Diagnosis not present

## 2020-12-23 DIAGNOSIS — Z6830 Body mass index (BMI) 30.0-30.9, adult: Secondary | ICD-10-CM | POA: Diagnosis not present

## 2020-12-23 DIAGNOSIS — Z0000 Encounter for general adult medical examination without abnormal findings: Secondary | ICD-10-CM | POA: Diagnosis not present

## 2020-12-23 DIAGNOSIS — Z1339 Encounter for screening examination for other mental health and behavioral disorders: Secondary | ICD-10-CM | POA: Diagnosis not present

## 2020-12-23 DIAGNOSIS — E7800 Pure hypercholesterolemia, unspecified: Secondary | ICD-10-CM | POA: Diagnosis not present

## 2021-01-05 DIAGNOSIS — Z79899 Other long term (current) drug therapy: Secondary | ICD-10-CM | POA: Diagnosis not present

## 2021-01-05 DIAGNOSIS — M859 Disorder of bone density and structure, unspecified: Secondary | ICD-10-CM | POA: Diagnosis not present

## 2021-01-05 DIAGNOSIS — E2839 Other primary ovarian failure: Secondary | ICD-10-CM | POA: Diagnosis not present

## 2021-01-27 DIAGNOSIS — I714 Abdominal aortic aneurysm, without rupture: Secondary | ICD-10-CM | POA: Diagnosis not present

## 2021-01-27 DIAGNOSIS — J439 Emphysema, unspecified: Secondary | ICD-10-CM | POA: Diagnosis not present

## 2021-01-27 DIAGNOSIS — Z299 Encounter for prophylactic measures, unspecified: Secondary | ICD-10-CM | POA: Diagnosis not present

## 2021-01-27 DIAGNOSIS — I10 Essential (primary) hypertension: Secondary | ICD-10-CM | POA: Diagnosis not present

## 2021-01-27 DIAGNOSIS — M8580 Other specified disorders of bone density and structure, unspecified site: Secondary | ICD-10-CM | POA: Diagnosis not present

## 2021-01-27 DIAGNOSIS — N183 Chronic kidney disease, stage 3 unspecified: Secondary | ICD-10-CM | POA: Diagnosis not present

## 2021-03-03 DIAGNOSIS — H524 Presbyopia: Secondary | ICD-10-CM | POA: Diagnosis not present

## 2021-03-03 DIAGNOSIS — I10 Essential (primary) hypertension: Secondary | ICD-10-CM | POA: Diagnosis not present

## 2021-03-03 DIAGNOSIS — E7800 Pure hypercholesterolemia, unspecified: Secondary | ICD-10-CM | POA: Diagnosis not present

## 2021-03-06 DIAGNOSIS — F17210 Nicotine dependence, cigarettes, uncomplicated: Secondary | ICD-10-CM | POA: Diagnosis not present

## 2021-03-06 DIAGNOSIS — Z6830 Body mass index (BMI) 30.0-30.9, adult: Secondary | ICD-10-CM | POA: Diagnosis not present

## 2021-03-06 DIAGNOSIS — M797 Fibromyalgia: Secondary | ICD-10-CM | POA: Diagnosis not present

## 2021-03-06 DIAGNOSIS — I10 Essential (primary) hypertension: Secondary | ICD-10-CM | POA: Diagnosis not present

## 2021-03-06 DIAGNOSIS — R69 Illness, unspecified: Secondary | ICD-10-CM | POA: Diagnosis not present

## 2021-03-06 DIAGNOSIS — J439 Emphysema, unspecified: Secondary | ICD-10-CM | POA: Diagnosis not present

## 2021-03-06 DIAGNOSIS — G4700 Insomnia, unspecified: Secondary | ICD-10-CM | POA: Diagnosis not present

## 2021-03-06 DIAGNOSIS — M62838 Other muscle spasm: Secondary | ICD-10-CM | POA: Diagnosis not present

## 2021-03-06 DIAGNOSIS — Z299 Encounter for prophylactic measures, unspecified: Secondary | ICD-10-CM | POA: Diagnosis not present

## 2021-05-29 DIAGNOSIS — Z299 Encounter for prophylactic measures, unspecified: Secondary | ICD-10-CM | POA: Diagnosis not present

## 2021-05-29 DIAGNOSIS — G4700 Insomnia, unspecified: Secondary | ICD-10-CM | POA: Diagnosis not present

## 2021-05-29 DIAGNOSIS — M797 Fibromyalgia: Secondary | ICD-10-CM | POA: Diagnosis not present

## 2021-05-29 DIAGNOSIS — R5383 Other fatigue: Secondary | ICD-10-CM | POA: Diagnosis not present

## 2021-05-29 DIAGNOSIS — M62838 Other muscle spasm: Secondary | ICD-10-CM | POA: Diagnosis not present

## 2021-05-29 DIAGNOSIS — F17210 Nicotine dependence, cigarettes, uncomplicated: Secondary | ICD-10-CM | POA: Diagnosis not present

## 2021-05-29 DIAGNOSIS — Z6831 Body mass index (BMI) 31.0-31.9, adult: Secondary | ICD-10-CM | POA: Diagnosis not present

## 2021-05-29 DIAGNOSIS — R69 Illness, unspecified: Secondary | ICD-10-CM | POA: Diagnosis not present

## 2021-05-29 DIAGNOSIS — I10 Essential (primary) hypertension: Secondary | ICD-10-CM | POA: Diagnosis not present

## 2021-09-24 IMAGING — CT CT CTA ABD/PEL W/CM AND/OR W/O CM
2 of 6 series · 14 of 46 positions shown, 16 images · IV contrast (Omnipaque or Isovue)
Comparison: CT 03/10/2020

CLINICAL DATA: 74-year-old female with known abdominal aortic
aneurysm, and questionable enlargement on prior ultrasound

EXAM:
CTA ABDOMEN AND PELVIS WITHOUT AND WITH CONTRAST
TECHNIQUE: Multidetector CT imaging of the abdomen and pelvis was performed
using the standard protocol during bolus administration of
intravenous contrast. Multiplanar reconstructed images and MIPs were
obtained and reviewed to evaluate the vascular anatomy.
CONTRAST:  100mL OMNIPAQUE IOHEXOL 350 MG/ML SOLN

[Series 4: aneurysm axial arterial · axial · arterial · 0.65mm/px · z∈[-445,-43]mm · 11 of 155 slices shown, 13 images]
[im 14/155  soft-tissue]
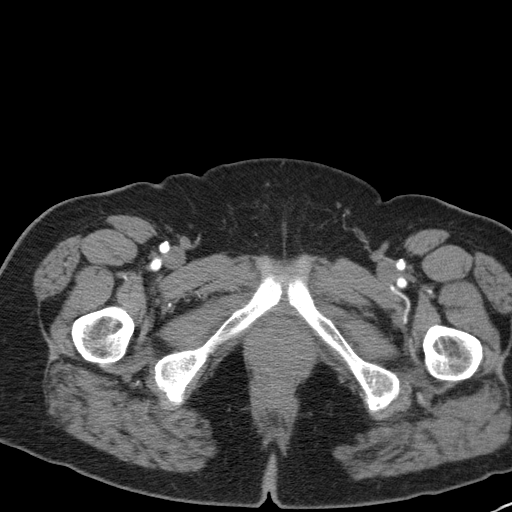
[im 14/155  bone]
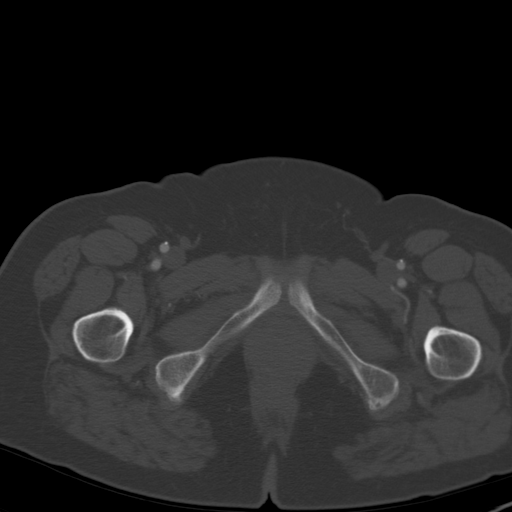
[im 27/155  soft-tissue]
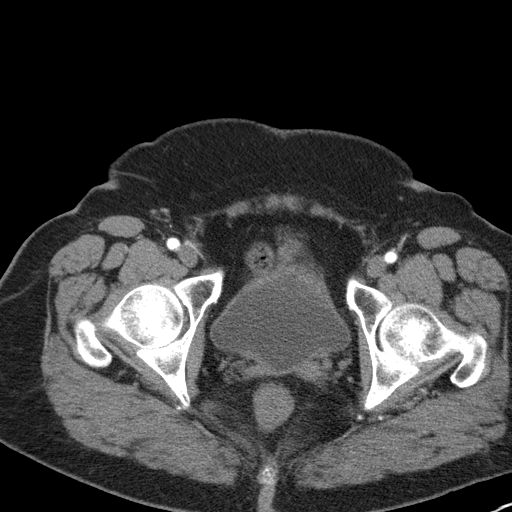
[im 41/155  soft-tissue]
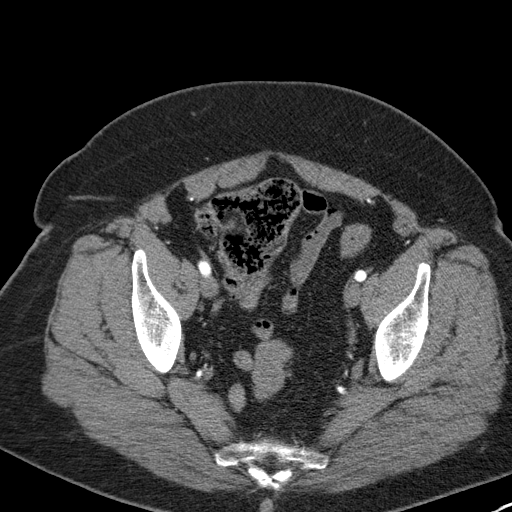
[im 54/155  soft-tissue]
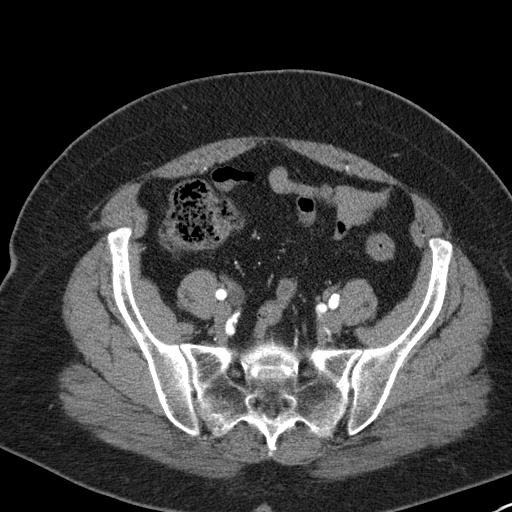
[im 67/155  soft-tissue]
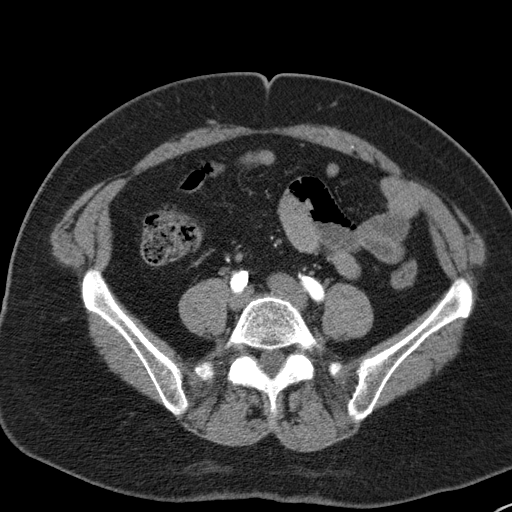
[im 81/155  soft-tissue]
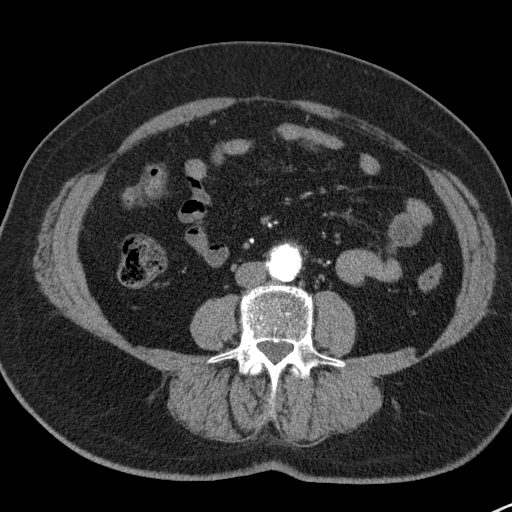
[im 94/155  soft-tissue]
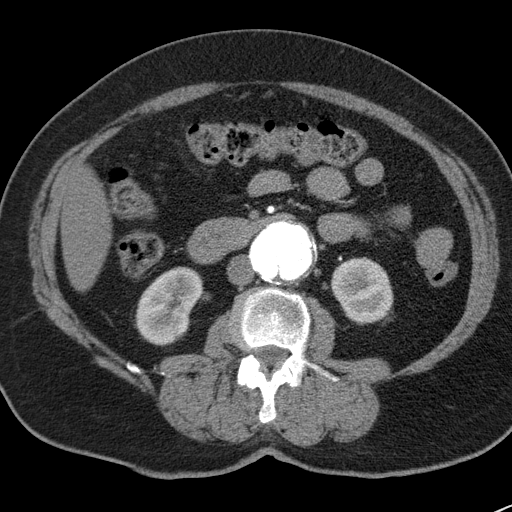
[im 108/155  soft-tissue]
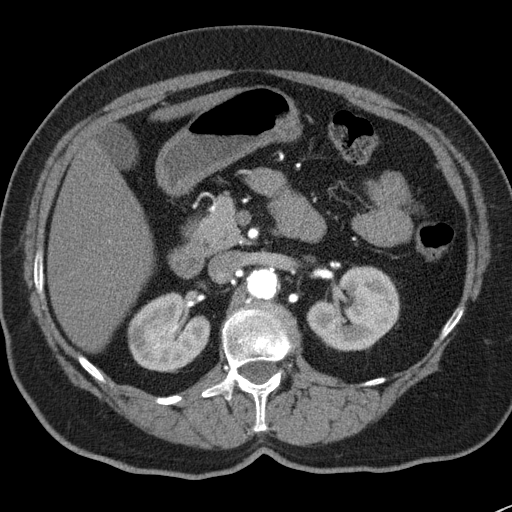
[im 121/155  soft-tissue]
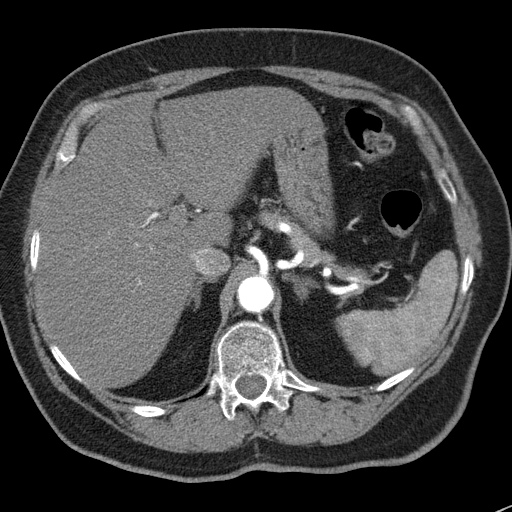
[im 121/155  bone]
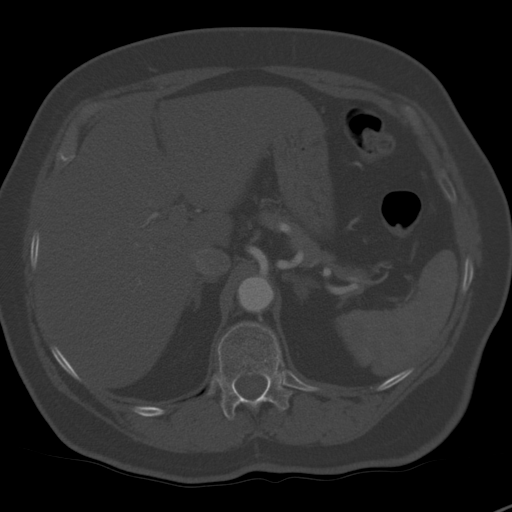
[im 134/155  soft-tissue]
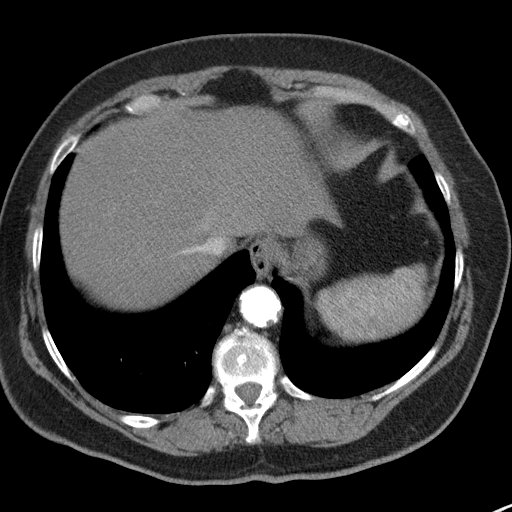
[im 148/155  soft-tissue]
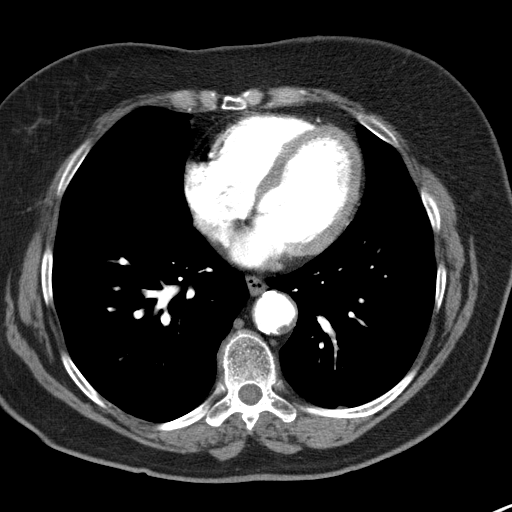

[Series 7: coronal · coronal · 0.73mm/px · 3 of 98 slices shown]
[im 25/98  soft-tissue]
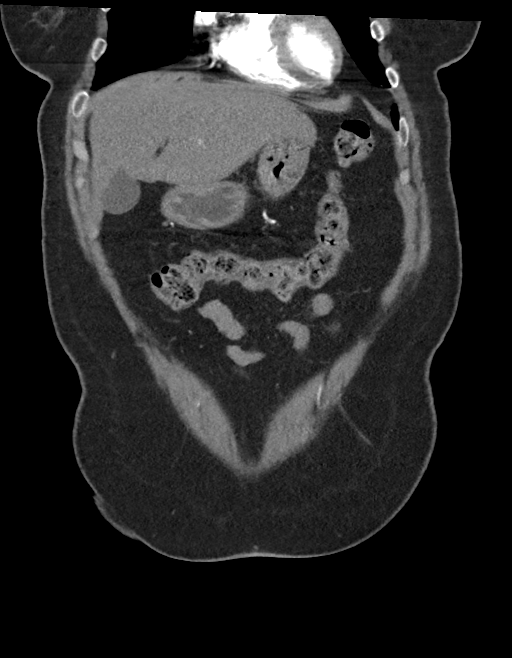
[im 49/98  soft-tissue]
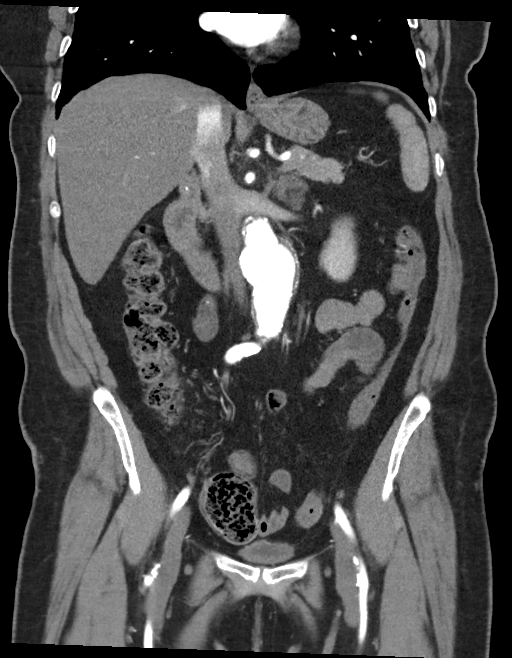
[im 73/98  soft-tissue]
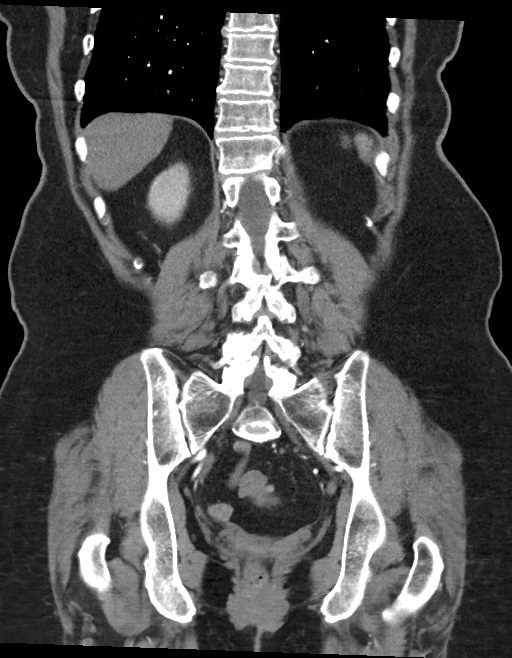

[14 of 46 positions shown; findings below may reference images not displayed]

FINDINGS: VASCULAR

Aorta: Atherosclerotic changes of the lower abdominal aorta.
Diameter at the hiatus 2.3 cm.

Juxtarenal aorta 2.4 cm. Greatest diameter of the infrarenal
abdominal aorta estimated 4.3 cm, similar location the prior. The
prior CT measures 4.2 cm. No periaortic fluid or inflammatory
changes.

Redemonstration of moderate atherosclerotic changes of the abdominal
aorta. Irregular plaque/chronic dissection at the level of the
aneurysm with partial calcification of the lamellar flap.

Celiac: Patent with minimal atherosclerosis at the origin.

SMA: Patent with minimal atherosclerosis at the origin.

Renals:

- Right: Main right renal artery with mild atherosclerosis and no
significant stenosis. There is accessory renal artery originating
just above the main right renal artery, to the lower segment.

- Left: Main left renal artery with moderate atherosclerotic changes
at the origin. Estimated 50% stenosis. Accessory left renal artery
originating just below the main left renal artery to the lower
collecting system segment.

IMA: IMA is occluded at the origin. Collateral flow fills the distal
branches

Right lower extremity:

Moderate atherosclerotic changes of the right common iliac artery
without evidence of high-grade stenosis or occlusion. Mild
tortuosity. Hypogastric artery is patent. External iliac artery with
minimal atherosclerosis. Common femoral artery with minimal
atherosclerosis. Proximal profunda femoris and SFA patent.

Left lower extremity:

Moderate atherosclerosis of the left common iliac artery without
high-grade stenosis or occlusion. Hypogastric artery is patent. Mild
tortuosity. External iliac artery with minimal atherosclerosis.

Common femoral artery with minimal atherosclerosis. Proximal
profunda femoris and SFA patent.

Veins: Unremarkable appearance of the venous system.

Review of the MIP images confirms the above findings.

NON-VASCULAR

Lower chest: No acute.

Hepatobiliary: Diffusely decreased attenuation of liver parenchyma,
compatible with steatosis. Focal fatty sparing adjacent to the
gallbladder fossa. Unremarkable gallbladder.

Pancreas: Unremarkable.

Spleen: Unremarkable.

Adrenals/Urinary Tract:

- Right adrenal gland: Unremarkable

- Left adrenal gland: Unremarkable.

- Right kidney: No hydronephrosis, nephrolithiasis, inflammation, or
ureteral dilation. Low-density non complex/enhancing cysts of the
right kidney are unchanged from the comparison.

- Left Kidney: No hydronephrosis, nephrolithiasis, inflammation, or
ureteral dilation. Small non complex/enhancing cyst from the lower
pole of the left kidney, unchanged. Likely cyst of the superior
cortex is too small to characterize.

- Urinary Bladder: Urinary bladder is relatively decompressed.

Stomach/Bowel:

- Stomach: Unremarkable.

- Small bowel: Unremarkable

- Appendix: Normal.

- Colon: Minimal diverticular change. No acute inflammatory changes.

Lymphatic: No adenopathy.

Mesenteric: No free fluid or air. No mesenteric adenopathy.

Reproductive: Hysterectomy

Other: Small fat containing umbilical hernia.

Musculoskeletal: Degenerative changes of the spine. No bony canal
narrowing. No acute fracture.
IMPRESSION: Relatively unchanged diameter/appearance of infrarenal abdominal
aortic aneurysm from the CT dated 03/10/2020, with the diameter
estimated on today's CT 4.3 cm. Aortic aneurysm NOS ([id number]).

Aortic Atherosclerosis ([id number]).

Additional ancillary findings as above.

## 2021-10-27 ENCOUNTER — Other Ambulatory Visit (HOSPITAL_COMMUNITY): Payer: Self-pay | Admitting: Vascular Surgery

## 2021-10-27 DIAGNOSIS — I714 Abdominal aortic aneurysm, without rupture, unspecified: Secondary | ICD-10-CM

## 2021-10-28 ENCOUNTER — Ambulatory Visit (INDEPENDENT_AMBULATORY_CARE_PROVIDER_SITE_OTHER): Payer: Medicare HMO

## 2021-10-28 ENCOUNTER — Ambulatory Visit: Payer: Medicare HMO | Admitting: Vascular Surgery

## 2021-10-28 ENCOUNTER — Other Ambulatory Visit: Payer: Self-pay

## 2021-10-28 ENCOUNTER — Encounter: Payer: Self-pay | Admitting: Vascular Surgery

## 2021-10-28 VITALS — BP 165/85 | HR 69 | Temp 97.7°F | Resp 18 | Ht 65.0 in | Wt 180.0 lb

## 2021-10-28 DIAGNOSIS — I714 Abdominal aortic aneurysm, without rupture, unspecified: Secondary | ICD-10-CM

## 2021-10-28 NOTE — Progress Notes (Signed)
[facility] [place]  Patient name: Kelli Deleon MRN: [id number] DOB: February 03, 1946 Sex: female  REASON FOR VISIT: Follow-up known abdominal aortic aneurysm  HPI: Kelli Deleon is a 75 y.o. female here today for ultrasound follow-up of known abdominal aortic aneurysm.  She remains in good health with no new health issues.  He does have some degenerative disc disease with chronic back pain.  Past Medical History:  Diagnosis Date   GERD (gastroesophageal reflux disease)    High cholesterol    Hypertension     Family History  Problem Relation Age of Onset   Cancer Mother        lung   Tuberculosis Mother    Tuberculosis Father    Stroke Father    COPD Sister    Hypertension Sister    Anxiety disorder Sister    Hypertension Sister    Heart disease Brother    Hypertension Brother    Atrial fibrillation Brother    Neuropathy Brother    Cancer Brother    Cancer Brother    Cancer Brother    Kidney disease Brother    Lung disease Brother    Heart attack Brother    Pneumonia Brother    Heart attack Brother     SOCIAL HISTORY: Social History   Tobacco Use   Smoking status: Every Day    Packs/day: 2.00    Years: 60.00    Pack years: 120.00    Types: Cigarettes   Smokeless tobacco: Never  Substance Use Topics   Alcohol use: No    Allergies  Allergen Reactions   Hydrocodone-Acetaminophen Hives and Itching   Carbamazepine Other (See Comments)    Fatigue, intolerance, trigeminal neuralgia   Gabapentin Itching and Nausea And Vomiting    Headache   Lisinopril Cough    aching, musculoskeletal   Morphine Nausea And Vomiting   Pregabalin Swelling    Current Outpatient Medications  Medication Sig Dispense Refill   amLODipine (NORVASC) 5 MG tablet Take 5 mg by mouth every evening.      aspirin EC 81 MG tablet Take 81-162 mg by mouth daily as needed (chest pain).      Cholecalciferol (VITAMIN D3) 1000 units CAPS  Take 1,000 Units by mouth daily.      DULoxetine (CYMBALTA) 30 MG capsule Take 30 mg by mouth every evening.      Multiple Vitamins-Minerals (CENTRUM SILVER 50+WOMEN PO) Take 1 tablet by mouth daily.     pantoprazole (PROTONIX) 40 MG tablet Take 40 mg by mouth daily as needed (acid reflux).      phenylephrine (SUDAFED PE) 10 MG TABS tablet Take 10 mg by mouth every 6 (six) hours as needed (sinus headaches).     rosuvastatin (CRESTOR) 10 MG tablet Take 10 mg by mouth every evening.      traMADol (ULTRAM) 50 MG tablet Take 50 mg by mouth every 6 (six) hours as needed for moderate pain.      No current facility-administered medications for this visit.    REVIEW OF SYSTEMS:  [X]  denotes positive finding, [ ]  denotes negative finding Cardiac  Comments:  Chest pain or chest pressure:    Shortness of breath upon exertion:    Short of breath when lying flat:    Irregular heart rhythm:        Vascular    Pain in calf, thigh, or hip brought on by ambulation:    Pain in feet at night that  wakes you up from your sleep:     Blood clot in your veins:    Leg swelling:           PHYSICAL EXAM: Vitals:   10/28/21 1050  BP: (!) 165/85  Pulse: 69  Resp: 18  Temp: 97.7 F (36.5 C)  TempSrc: Oral  SpO2: 95%  Weight: 180 lb (81.6 kg)  Height: 5\' 5"  (1.651 m)    GENERAL: The patient is a well-nourished female, in no acute distress. The vital signs are documented above. CARDIOVASCULAR: 2+ radial and 2+ dorsalis pedis pulses bilaterally.  Abdominal exam with moderate obesity.  I do not feel an aneurysm PULMONARY: There is good air exchange  MUSCULOSKELETAL: There are no major deformities or cyanosis. NEUROLOGIC: No focal weakness or paresthesias are detected. SKIN: There are no ulcers or rashes noted. PSYCHIATRIC: The patient has a normal affect.  DATA:  Ultrasound today shows maximal predicted size of her aorta is 4.58 cm.  Her CT scan of her abdomen in December 2001 revealed maximal  diameter of 4.3 cm.  MEDICAL ISSUES: I discussed this with the patient.  She had had 1 episode where ultrasounded predicted 5.0 cm and CT scan showed that this was an overcall.  I have recommended that we see her in 1 year with repeat CT scan.  Explained we would consider elective repair should she reach 5.0 cm.    Rosetta Posner, MD FACS [facility] [phone]  Note: Portions of this report may have been transcribed using voice recognition software.  Every effort has been made to ensure accuracy; however, inadvertent computerized transcription errors may still be present.

## 2021-11-18 DIAGNOSIS — Z6831 Body mass index (BMI) 31.0-31.9, adult: Secondary | ICD-10-CM | POA: Diagnosis not present

## 2021-11-18 DIAGNOSIS — G4700 Insomnia, unspecified: Secondary | ICD-10-CM | POA: Diagnosis not present

## 2021-11-18 DIAGNOSIS — Z299 Encounter for prophylactic measures, unspecified: Secondary | ICD-10-CM | POA: Diagnosis not present

## 2021-11-18 DIAGNOSIS — I10 Essential (primary) hypertension: Secondary | ICD-10-CM | POA: Diagnosis not present

## 2021-11-18 DIAGNOSIS — R5383 Other fatigue: Secondary | ICD-10-CM | POA: Diagnosis not present

## 2021-11-18 DIAGNOSIS — M797 Fibromyalgia: Secondary | ICD-10-CM | POA: Diagnosis not present

## 2021-12-10 ENCOUNTER — Encounter (HOSPITAL_COMMUNITY): Payer: Self-pay

## 2021-12-10 ENCOUNTER — Other Ambulatory Visit: Payer: Self-pay

## 2021-12-10 ENCOUNTER — Emergency Department (HOSPITAL_COMMUNITY): Payer: Medicare HMO

## 2021-12-10 ENCOUNTER — Observation Stay (HOSPITAL_COMMUNITY)
Admission: EM | Admit: 2021-12-10 | Discharge: 2021-12-11 | Disposition: A | Discharge status: Home / Self Care | Payer: Medicare HMO | Attending: Internal Medicine | Admitting: Internal Medicine

## 2021-12-10 DIAGNOSIS — K559 Vascular disorder of intestine, unspecified: Secondary | ICD-10-CM | POA: Diagnosis not present

## 2021-12-10 DIAGNOSIS — I7143 Infrarenal abdominal aortic aneurysm, without rupture: Secondary | ICD-10-CM | POA: Diagnosis not present

## 2021-12-10 DIAGNOSIS — Z7982 Long term (current) use of aspirin: Secondary | ICD-10-CM | POA: Diagnosis not present

## 2021-12-10 DIAGNOSIS — K921 Melena: Secondary | ICD-10-CM | POA: Diagnosis present

## 2021-12-10 DIAGNOSIS — F17210 Nicotine dependence, cigarettes, uncomplicated: Secondary | ICD-10-CM | POA: Insufficient documentation

## 2021-12-10 DIAGNOSIS — Z720 Tobacco use: Secondary | ICD-10-CM | POA: Diagnosis not present

## 2021-12-10 DIAGNOSIS — I10 Essential (primary) hypertension: Secondary | ICD-10-CM

## 2021-12-10 DIAGNOSIS — R69 Illness, unspecified: Secondary | ICD-10-CM | POA: Diagnosis not present

## 2021-12-10 DIAGNOSIS — Z79899 Other long term (current) drug therapy: Secondary | ICD-10-CM | POA: Diagnosis not present

## 2021-12-10 DIAGNOSIS — I714 Abdominal aortic aneurysm, without rupture, unspecified: Secondary | ICD-10-CM

## 2021-12-10 DIAGNOSIS — I700 Atherosclerosis of aorta: Secondary | ICD-10-CM | POA: Diagnosis not present

## 2021-12-10 DIAGNOSIS — K625 Hemorrhage of anus and rectum: Secondary | ICD-10-CM | POA: Diagnosis not present

## 2021-12-10 DIAGNOSIS — K529 Noninfective gastroenteritis and colitis, unspecified: Secondary | ICD-10-CM | POA: Diagnosis not present

## 2021-12-10 HISTORY — DX: Fibromyalgia: M79.7

## 2021-12-10 HISTORY — DX: Chronic kidney disease, unspecified: N18.9

## 2021-12-10 LAB — POC OCCULT BLOOD, ED: Fecal Occult Bld: POSITIVE — AB

## 2021-12-10 LAB — CBC WITH DIFFERENTIAL/PLATELET
Abs Immature Granulocytes: 0.05 10*3/uL (ref 0.00–0.07)
Basophils Absolute: 0.1 10*3/uL (ref 0.0–0.1)
Basophils Relative: 1 %
Eosinophils Absolute: 0.2 10*3/uL (ref 0.0–0.5)
Eosinophils Relative: 1 %
HCT: 45 % (ref 36.0–46.0)
Hemoglobin: 14.8 g/dL (ref 12.0–15.0)
Immature Granulocytes: 0 %
Lymphocytes Relative: 25 %
Lymphs Abs: 3 10*3/uL (ref 0.7–4.0)
MCH: 29.7 pg (ref 26.0–34.0)
MCHC: 32.9 g/dL (ref 30.0–36.0)
MCV: 90.4 fL (ref 80.0–100.0)
Monocytes Absolute: 0.6 10*3/uL (ref 0.1–1.0)
Monocytes Relative: 5 %
Neutro Abs: 8 10*3/uL — ABNORMAL HIGH (ref 1.7–7.7)
Neutrophils Relative %: 68 %
Platelets: 333 10*3/uL (ref 150–400)
RBC: 4.98 MIL/uL (ref 3.87–5.11)
RDW: 13.6 % (ref 11.5–15.5)
WBC: 11.9 10*3/uL — ABNORMAL HIGH (ref 4.0–10.5)
nRBC: 0 % (ref 0.0–0.2)

## 2021-12-10 LAB — COMPREHENSIVE METABOLIC PANEL
ALT: 42 U/L (ref 0–44)
AST: 53 U/L — ABNORMAL HIGH (ref 15–41)
Albumin: 4.3 g/dL (ref 3.5–5.0)
Alkaline Phosphatase: 74 U/L (ref 38–126)
Anion gap: 11 (ref 5–15)
BUN: 12 mg/dL (ref 8–23)
CO2: 23 mmol/L (ref 22–32)
Calcium: 9.2 mg/dL (ref 8.9–10.3)
Chloride: 102 mmol/L (ref 98–111)
Creatinine, Ser: 1.13 mg/dL — ABNORMAL HIGH (ref 0.44–1.00)
GFR, Estimated: 51 mL/min — ABNORMAL LOW (ref >60)
Glucose, Bld: 107 mg/dL — ABNORMAL HIGH (ref 70–99)
Potassium: 3.9 mmol/L (ref 3.5–5.1)
Sodium: 136 mmol/L (ref 135–145)
Total Bilirubin: 0.6 mg/dL (ref 0.3–1.2)
Total Protein: 7.5 g/dL (ref 6.5–8.1)

## 2021-12-10 LAB — TYPE AND SCREEN
ABO/RH(D): O POS
Antibody Screen: NEGATIVE

## 2021-12-10 LAB — URINALYSIS, ROUTINE W REFLEX MICROSCOPIC
Bilirubin Urine: NEGATIVE
Glucose, UA: NEGATIVE mg/dL
Ketones, ur: NEGATIVE mg/dL
Leukocytes,Ua: NEGATIVE
Nitrite: NEGATIVE
Protein, ur: NEGATIVE mg/dL
Specific Gravity, Urine: 1.018 (ref 1.005–1.030)
pH: 7 (ref 5.0–8.0)

## 2021-12-10 LAB — PROTIME-INR
INR: 1 (ref 0.8–1.2)
Prothrombin Time: 13.4 seconds (ref 11.4–15.2)

## 2021-12-10 LAB — LACTIC ACID, PLASMA: Lactic Acid, Venous: 1.2 mmol/L (ref 0.5–1.9)

## 2021-12-10 MED ORDER — NICOTINE 21 MG/24HR TD PT24
21.0000 mg | MEDICATED_PATCH | Freq: Every day | TRANSDERMAL | Status: DC
  Administered 2021-12-10 – 2021-12-11 (×2): 21 mg via TRANSDERMAL
  Filled 2021-12-10 (×2): qty 1

## 2021-12-10 MED ORDER — ACETAMINOPHEN 650 MG RE SUPP: 650.0000 mg | Freq: Four times a day (QID) | RECTAL | Status: DC | PRN

## 2021-12-10 MED ORDER — LACTATED RINGERS IV BOLUS
1000.0000 mL | Freq: Once | INTRAVENOUS | Status: AC
  Administered 2021-12-10: 1000 mL via INTRAVENOUS

## 2021-12-10 MED ORDER — FENTANYL CITRATE PF 50 MCG/ML IJ SOSY
50.0000 ug | PREFILLED_SYRINGE | Freq: Once | INTRAMUSCULAR | Status: AC
  Administered 2021-12-10: 50 ug via INTRAVENOUS
  Filled 2021-12-10: qty 1

## 2021-12-10 MED ORDER — ROSUVASTATIN CALCIUM 10 MG PO TABS
10.0000 mg | ORAL_TABLET | ORAL | Status: DC
  Administered 2021-12-11: 10 mg via ORAL
  Filled 2021-12-10: qty 1

## 2021-12-10 MED ORDER — SODIUM CHLORIDE 0.9 % IV SOLN: INTRAVENOUS | Status: DC

## 2021-12-10 MED ORDER — ACETAMINOPHEN 325 MG PO TABS: 650.0000 mg | ORAL_TABLET | Freq: Four times a day (QID) | ORAL | Status: DC | PRN

## 2021-12-10 MED ORDER — ONDANSETRON HCL 4 MG PO TABS
4.0000 mg | ORAL_TABLET | Freq: Four times a day (QID) | ORAL | Status: DC | PRN
Start: 2021-12-10 — End: 2021-12-11

## 2021-12-10 MED ORDER — DULOXETINE HCL 30 MG PO CPEP
30.0000 mg | ORAL_CAPSULE | Freq: Every evening | ORAL | Status: DC
  Administered 2021-12-10: 30 mg via ORAL
  Filled 2021-12-10: qty 1

## 2021-12-10 MED ORDER — VITAMIN D 25 MCG (1000 UNIT) PO TABS
1000.0000 [IU] | ORAL_TABLET | Freq: Every day | ORAL | Status: DC
  Administered 2021-12-11: 1000 [IU] via ORAL
  Filled 2021-12-10: qty 1

## 2021-12-10 MED ORDER — IOHEXOL 350 MG/ML SOLN
100.0000 mL | Freq: Once | INTRAVENOUS | Status: AC | PRN
  Administered 2021-12-10: 100 mL via INTRAVENOUS

## 2021-12-10 MED ORDER — MORPHINE SULFATE (PF) 2 MG/ML IV SOLN: 1.0000 mg | INTRAVENOUS | Status: DC | PRN

## 2021-12-10 MED ORDER — CYCLOBENZAPRINE HCL 10 MG PO TABS
10.0000 mg | ORAL_TABLET | Freq: Every day | ORAL | Status: DC
Start: 2021-12-10 — End: 2021-12-11
  Administered 2021-12-10: 10 mg via ORAL
  Filled 2021-12-10: qty 1

## 2021-12-10 MED ORDER — METHOCARBAMOL 500 MG PO TABS
500.0000 mg | ORAL_TABLET | Freq: Two times a day (BID) | ORAL | Status: DC
  Administered 2021-12-11 (×2): 500 mg via ORAL
  Filled 2021-12-10 (×2): qty 1

## 2021-12-10 MED ORDER — ONDANSETRON HCL 4 MG/2ML IJ SOLN: 4.0000 mg | Freq: Four times a day (QID) | INTRAMUSCULAR | Status: DC | PRN

## 2021-12-10 NOTE — ED Notes (Signed)
Put pt in gown and on the cardiac monitor

## 2021-12-10 NOTE — Consult Note (Addendum)
Referring Provider: Dr. Carles Collet  Primary Care Physician:  Berenice Primas Primary Gastroenterologist:  Dr. Gala Romney  Date of Admission: 12/10/21 Date of Consultation: 12/10/21  Reason for Consultation:  Ischemic Colitis   HPI:  Kelli Deleon is a 76 y.o. year old female presenting with abdominal pain and rectal bleeding. Hgb 14.8 on admission, mildly elevated WBC count at 11.9, and CTA abd/pelvis without active GI bleeding. Colitis noted involving descending and sigmoid colon. Celiac and SMA widely patent. Chronic proximal IMA occlusion. GI now consulted.  Patient states onset of abdominal cramping yesterday around 4 pm, followed by large amount of rectal bleeding. She notes numerous episodes of rectal bleeding, stating up to 12. She still has lower abdominal discomfort but has improved. Feels that the amount of blood is tapering. Had nausea at onset but none now. No prior episodes.   Believes last colonoscopy about 5 or so years ago in [place], with diverticulosis. No polyps. She does have 2 siblings with colon polyps.   Past Medical History:  Diagnosis Date   CKD (chronic kidney disease)    Fibromyalgia    GERD (gastroesophageal reflux disease)    High cholesterol    Hypertension     Past Surgical History:  Procedure Laterality Date   ABDOMINAL HYSTERECTOMY     CATARACT EXTRACTION W/PHACO Right 06/22/2019   Procedure: CATARACT EXTRACTION PHACO AND INTRAOCULAR LENS PLACEMENT ([place]);  Surgeon: Baruch Goldmann, MD;  Location: [facility];  Service: Ophthalmology;  Laterality: Right;  CDE: 6.30   CATARACT EXTRACTION W/PHACO Left 07/06/2019   Procedure: CATARACT EXTRACTION PHACO AND INTRAOCULAR LENS PLACEMENT ([facility]);  Surgeon: Baruch Goldmann, MD;  Location: [facility];  Service: Ophthalmology;  Laterality: Left;  left, CDE: 4.88   STAPEDES SURGERY Bilateral    1975 and #2 in 1990's    Prior to Admission medications   Medication Sig Start Date End Date Taking? Authorizing Provider  amLODipine  (NORVASC) 5 MG tablet Take 5 mg by mouth every evening.  12/30/17  Yes [provider]  aspirin EC 81 MG tablet Take 81-162 mg by mouth daily as needed (chest pain).    Yes [provider]  BIOTIN PO Take 1 tablet by mouth daily.   Yes [provider]  Cholecalciferol (VITAMIN D3) 1000 units CAPS Take 1,000 Units by mouth daily.    Yes [provider]  cyclobenzaprine (FLEXERIL) 10 MG tablet Take 10 mg by mouth at bedtime. 11/14/21  Yes [provider]  DULoxetine (CYMBALTA) 30 MG capsule Take 30 mg by mouth every evening.  12/29/17  Yes [provider]  methocarbamol (ROBAXIN) 500 MG tablet Take 500 mg by mouth 2 (two) times daily. 11/25/21  Yes [provider]  Multiple Vitamins-Minerals (CENTRUM SILVER 50+WOMEN PO) Take 1 tablet by mouth daily.   Yes [provider]  pantoprazole (PROTONIX) 40 MG tablet Take 40 mg by mouth daily as needed (acid reflux).  12/29/17  Yes [provider]  phenylephrine (SUDAFED PE) 10 MG TABS tablet Take 10 mg by mouth every 6 (six) hours as needed (sinus headaches).   Yes [provider]  rosuvastatin (CRESTOR) 10 MG tablet Take 10 mg by mouth 3 (three) times a week. [address] wed, fri 01/02/18  Yes [provider]    No current facility-administered medications for this encounter.   Current Outpatient Medications  Medication Sig Dispense Refill   amLODipine (NORVASC) 5 MG tablet Take 5 mg by mouth every evening.  aspirin EC 81 MG tablet Take 81-162 mg by mouth daily as needed (chest pain).      BIOTIN PO Take 1 tablet by mouth daily.     Cholecalciferol (VITAMIN D3) 1000 units CAPS Take 1,000 Units by mouth daily.      cyclobenzaprine (FLEXERIL) 10 MG tablet Take 10 mg by mouth at bedtime.     DULoxetine (CYMBALTA) 30 MG capsule Take 30 mg by mouth every evening.      methocarbamol (ROBAXIN) 500 MG tablet Take 500 mg by mouth 2 (two) times daily.     Multiple  Vitamins-Minerals (CENTRUM SILVER 50+WOMEN PO) Take 1 tablet by mouth daily.     pantoprazole (PROTONIX) 40 MG tablet Take 40 mg by mouth daily as needed (acid reflux).      phenylephrine (SUDAFED PE) 10 MG TABS tablet Take 10 mg by mouth every 6 (six) hours as needed (sinus headaches).     rosuvastatin (CRESTOR) 10 MG tablet Take 10 mg by mouth 3 (three) times a week. Mon, wed, fri      Allergies as of 12/10/2021 - Review Complete 12/10/2021  Allergen Reaction Noted   Hydrocodone-acetaminophen Hives and Itching 11/26/2014   Carbamazepine Other (See Comments) 11/27/2014   Gabapentin Itching and Nausea And Vomiting 11/27/2014   Lisinopril Cough 11/27/2014   Morphine Nausea And Vomiting 11/26/2014   Pregabalin Swelling 11/26/2014    Family History  Problem Relation Age of Onset   Cancer Mother        lung   Tuberculosis Mother    Tuberculosis Father    Stroke Father    COPD Sister    Hypertension Sister    Anxiety disorder Sister    Hypertension Sister    Heart disease Brother    Hypertension Brother    Atrial fibrillation Brother    Neuropathy Brother    Cancer Brother    Cancer Brother    Cancer Brother    Kidney disease Brother    Lung disease Brother    Heart attack Brother    Pneumonia Brother    Heart attack Brother    Colon cancer Neg Hx     Social History   Socioeconomic History   Marital status: Married    Spouse name: Not on file   Number of children: Not on file   Years of education: Not on file   Highest education level: Not on file  Occupational History   Not on file  Tobacco Use   Smoking status: Every Day    Packs/day: 2.00    Years: 60.00    Pack years: 120.00    Types: Cigarettes   Smokeless tobacco: Never  Vaping Use   Vaping Use: Every day   Start date: 08/15/2017  Substance and Sexual Activity   Alcohol use: No   Drug use: No   Sexual activity: Not Currently  Other Topics Concern   Not on file  Social History Narrative   Not on  file   Social Determinants of Health   Financial Resource Strain: Not on file  Food Insecurity: Not on file  Transportation Needs: Not on file  Physical Activity: Not on file  Stress: Not on file  Social Connections: Not on file  Intimate Partner Violence: Not on file    Review of Systems: Gen: Denies fever, chills, loss of appetite, change in weight or weight loss CV: Denies chest pain, heart palpitations, syncope, edema  Resp: Denies shortness of breath with rest, cough, wheezing GI:see HPI  GU : Denies urinary burning, urinary frequency, urinary incontinence.  MS: Denies joint pain,swelling, cramping Derm: Denies rash, itching, dry skin Psych: Denies depression, anxiety,confusion, or memory loss Heme: see HPI  Physical Exam: Vital signs in last 24 hours: Temp:  [97.8 F (36.6 C)] 97.8 F (36.6 C) (01/26 0904) Pulse Rate:  [63-81] 63 (01/26 1515) Resp:  [14-20] 14 (01/26 1515) BP: (126-154)/(66-90) 139/77 (01/26 1308) SpO2:  [91 %-98 %] 96 % (01/26 1515) Weight:  [81.6 kg] 81.6 kg (01/26 0900)   General:   Alert,  Well-developed, well-nourished, pleasant and cooperative in NAD Head:  Normocephalic and atraumatic. Eyes:  Sclera clear, no icterus.   Conjunctiva pink. Ears:  Normal auditory acuity. Nose:  No deformity, discharge,  or lesions. Lungs:  Clear throughout to auscultation.   No wheezes, crackles, or rhonchi. No acute distress. Heart:  S1 S2 present without murmurs. Abdomen:  Soft, mild TTP lower abdomen and nondistended. No masses, hepatosplenomegaly or hernias noted. Normal bowel sounds, without guarding, and without rebound.   Rectal:  Deferred until time of colonoscopy.   Msk:  Symmetrical without gross deformities. Normal posture. Pulses:  Normal pulses noted. Extremities:  Without  edema. Neurologic:  Alert and  oriented x4 Psych:  Alert and cooperative. Normal mood and affect.  Intake/Output from previous day: No intake/output data  recorded. Intake/Output this shift: Total I/O In: 1000 [IV Piggyback:1000] Out: -   Lab Results: Recent Labs    12/10/21 0916  WBC 11.9*  HGB 14.8  HCT 45.0  PLT 333   BMET Recent Labs    12/10/21 0916  NA 136  K 3.9  CL 102  CO2 23  GLUCOSE 107*  BUN 12  CREATININE 1.13*  CALCIUM 9.2   LFT Recent Labs    12/10/21 0916  PROT 7.5  ALBUMIN 4.3  AST 53*  ALT 42  ALKPHOS 74  BILITOT 0.6   PT/INR Recent Labs    12/10/21 0916  LABPROT 13.4  INR 1.0     Studies/Results: CT Angio Abd/Pel W and/or Wo Contrast  Result Date: 12/10/2021 CLINICAL DATA:  Abdominal pain, lower GI bleed EXAM: CTA ABDOMEN AND PELVIS WITHOUT AND WITH CONTRAST TECHNIQUE: Multidetector CT imaging of the abdomen and pelvis was performed using the standard protocol during bolus administration of intravenous contrast. Multiplanar reconstructed images and MIPs were obtained and reviewed to evaluate the vascular anatomy. RADIATION DOSE REDUCTION: This exam was performed according to the departmental dose-optimization program which includes automated exposure control, adjustment of the mA and/or kV according to patient size and/or use of iterative reconstruction technique. CONTRAST:  172mL OMNIPAQUE IOHEXOL 350 MG/ML SOLN COMPARISON:  CTA 11/12/2020 FINDINGS: VASCULAR Aorta: Extensive calcified atheromatous plaque. Fusiform infrarenal aneurysm measuring 4.4 x 4.1 cm maximum transverse diameter (previously 4.3). There is a curvilinear partially calcified intraluminal flap as before probably related to right lateral penetrating atheromatous ulcer. No evidence of leak or impending rupture. Celiac: Patent without evidence of aneurysm, dissection, vasculitis or significant stenosis. SMA: Patent without evidence of aneurysm, dissection, vasculitis or significant stenosis. Replaced right hepatic arterial supply, an anatomic variant. Renals: Duplicated left, superior dominant, with calcified ostial plaque resulting  in short segment stenosis of at least mild severity, patent distally. Duplicated right, inferior dominant, both patent. IMA: Short-segment origin occlusion, reconstituted distally by visceral collaterals. Inflow: Scattered calcified atheromatous plaque. No aneurysm, dissection, or stenosis. Proximal Outflow: Mildly atheromatous, patent Veins: Patent hepatic veins, portal vein, SM V, splenic vein, bilateral renal veins, iliac venous system and  IVC. No venous pathology evident. Review of the MIP images confirms the above findings. NON-VASCULAR Lower chest: Coronary calcifications. No pleural or pericardial effusion. Hepatobiliary: No focal liver abnormality is seen. No gallstones, gallbladder wall thickening, or biliary dilatation. Pancreas: Unremarkable. No pancreatic ductal dilatation or surrounding inflammatory changes. Spleen: Normal in size without focal abnormality. Adrenals/Urinary Tract: No adrenal mass. Multiple cortical lesions are again seen throughout both kidneys, some of which can be characterized as cysts. No urolithiasis or hydronephrosis. Urinary bladder is physiologically distended. Stomach/Bowel: Stomach is incompletely distended, unremarkable. Small bowel decompressed. Appendix not identified. The colon is nondilated. Circumferential wall thickening in the distal descending and proximal sigmoid colon with mild adjacent regional inflammatory change. No focus of active extravasation. A few scattered sigmoid diverticula containing hyperdense material. Lymphatic: No abdominal or pelvic adenopathy. Reproductive: Status post hysterectomy. No adnexal masses. Other: No ascites.  No free air. Musculoskeletal: No acute or significant osseous findings. IMPRESSION: 1. No  evidence of active GI bleed. 2. Colitis involving the descending and sigmoid colon, new since prior study. 3. Celiac axis and superior mesenteric artery are widely patent. Chronic proximal IMA occlusion. 4. Stable 4.4 cm infrarenal abdominal  aortic aneurysm. Recommend follow-up every 12 months and vascular consultation. This recommendation follows ACR consensus guidelines: White Paper of the ACR Incidental Findings Committee II on Vascular Findings. J Am Coll Radiol 2013; 10:789-794. Critical Value/emergent results were called by telephone at the time of interpretation on 12/10/2021 at 12:35 pm to provider [facility] , who verbally acknowledged these results. Electronically Signed   By: Lucrezia Europe M.D.   On: 12/10/2021 12:35    Impression: 76 y.o. year old female presenting with clinical scenario most consistent with ischemic colitis. Hgb 14.8 on admission, and CTA  abd/pelvis without active GI bleeding. Colitis noted involving descending and sigmoid colon. Celiac and SMA widely patent. Chronic proximal IMA occlusion. Known stable abdominal aortic aneurysm.   She has had improvement in abdominal pain and rectal bleeding tapering. We discussed supportive measures at this time. Hopefully, she can be discharged tomorrow if remains stable.   Last colonoscopy approximately 5 years ago in [place] with diverticulosis. We discussed outpatient colonoscopy once over acute illness.   Plan: Clear liquids Follow CBC Supportive measures Outpatient colonoscopy Will reassess in am   Annitta Needs, PhD, ANP-BC [facility]     LOS: 0 days    12/10/2021, 3:49 PM

## 2021-12-10 NOTE — ED Provider Notes (Signed)
[facility] Provider Note  CSN: [id number] Arrival date & time: 12/10/21 3846  History Chief Complaint  Patient presents with   Abdominal Pain    MARKEESHA CHAR is a 76 y.o. female with a known history of intrarenal AAA last evaluated in Dec 22 at Dr. Luther Parody office and found to be ~4.5cm who also reports she had diverticulosis on her most recent colonoscopy. She reports about 3pm yesterday afternoon she began to have severe cramping lower abdominal pain followed by a bright red bloody stool. She has continued to have episodes of pain and rectal bleeding through the night and this morning. No CP, SOB, fever, N/V. She reports feeling a little lightheaded but has not had syncope. She reports her pain radiates into her back but she also has chronic back pain and is unsure if this is related.    Home Medications Prior to Admission medications   Medication Sig Start Date End Date Taking? Authorizing Provider  amLODipine (NORVASC) 5 MG tablet Take 5 mg by mouth every evening.  12/30/17  Yes [provider]  aspirin EC 81 MG tablet Take 81-162 mg by mouth daily as needed (chest pain).    Yes [provider]  BIOTIN PO Take 1 tablet by mouth daily.   Yes [provider]  Cholecalciferol (VITAMIN D3) 1000 units CAPS Take 1,000 Units by mouth daily.    Yes [provider]  cyclobenzaprine (FLEXERIL) 10 MG tablet Take 10 mg by mouth at bedtime. 11/14/21  Yes [provider]  DULoxetine (CYMBALTA) 30 MG capsule Take 30 mg by mouth every evening.  12/29/17  Yes [provider]  methocarbamol (ROBAXIN) 500 MG tablet Take 500 mg by mouth 2 (two) times daily. 11/25/21  Yes [provider]  Multiple Vitamins-Minerals (CENTRUM SILVER 50+WOMEN PO) Take 1 tablet by mouth daily.   Yes [provider]  pantoprazole (PROTONIX) 40 MG tablet Take 40 mg by mouth daily as needed (acid reflux).  12/29/17  Yes [provider]  phenylephrine (SUDAFED PE) 10 MG TABS tablet Take 10 mg by mouth every 6 (six) hours as needed (sinus headaches).   Yes [provider]  rosuvastatin (CRESTOR) 10 MG tablet Take 10 mg by mouth 3 (three) times a week. Mon, wed, fri 01/02/18  Yes [provider]     Allergies    Hydrocodone-acetaminophen, Carbamazepine, Gabapentin, Lisinopril, Morphine, and Pregabalin   Review of Systems   Review of Systems Please see HPI for pertinent positives and negatives  Physical Exam BP 139/77    Pulse 67    Temp 97.8 F (36.6 C)    Resp 14    Ht 5\' 5"  (1.651 m)    Wt 81.6 kg    SpO2 95%    BMI 29.95 kg/m   Physical Exam Vitals and nursing note reviewed.  Constitutional:      Appearance: Normal appearance.  HENT:     Head: Normocephalic and atraumatic.     Nose: Nose normal.     Mouth/Throat:     Mouth: Mucous membranes are moist.  Eyes:     Extraocular Movements: Extraocular movements intact.     Conjunctiva/sclera: Conjunctivae normal.  Cardiovascular:     Rate and Rhythm: Normal rate.  Pulmonary:     Effort: Pulmonary effort is normal.     Breath sounds: Normal breath sounds.  Abdominal:     General: Abdomen is flat.     Palpations: Abdomen is soft.  Tenderness: There is abdominal tenderness in the right lower quadrant and left lower quadrant. There is guarding. Negative signs include Murphy's sign and McBurney's sign.  Musculoskeletal:        General: No swelling. Normal range of motion.     Cervical back: Neck supple.  Skin:    General: Skin is warm and dry.  Neurological:     General: No focal deficit present.     Mental Status: She is alert.  Psychiatric:        Mood and Affect: Mood normal.    ED Results / Procedures / Treatments   EKG None  Procedures Procedures  Medications Ordered in the ED Medications  lactated ringers bolus 1,000 mL (0 mLs Intravenous Stopped 12/10/21 1031)  fentaNYL (SUBLIMAZE) injection 50 mcg (50 mcg Intravenous  Given 12/10/21 0931)  iohexol (OMNIPAQUE) 350 MG/ML injection 100 mL (100 mLs Intravenous Contrast Given 12/10/21 1007)  fentaNYL (SUBLIMAZE) injection 50 mcg (50 mcg Intravenous Given 12/10/21 1308)    Initial Impression and Plan  Patient with known AAA here with abdominal pain and now BRPBR, will check labs, and send for CTA to eval signs of AAA rupture. Currently her vitals are normal. No signs of hemorrhagic shock. Fentanyl for pain.   ED Course   Clinical Course as of 12/10/21 1336  Thu Dec 10, 2021  0954 CMP is unremarkable.  [CS]  1004 CBC with mild leukocytosis. Otherwise normal Hgb.  [CS]  9295 INR is normal.  [CS]  1102 Rectal: Chaperon present, dark red blood, no mass, hemoccult positive.  [CS]  7473 UA is neg for infection.  [CS]  4037 Reviewed CT images with Dr. Vernard Gambles, Radiology. No change in AAA and no active contrast extravasation. There is signs of descending colitis which may account for her pain and bleeding. She remains hemodynamically stable.  [CS]  0964 Discussed with Dr. Gala Romney who suspects that this is a mesenteric ischemic problem but does not recommend any acute intervention or antibiotics. Will add lactic acid to her workup. She is beginning to have pain again so will discuss admission with hospitalist.  [CS]  64 Spoke with Dr. Carles Collet, Hospitalist, who will evaluate for admission.  [CS]    Clinical Course User Index [CS] Truddie Hidden, MD     MDM Rules/Calculators/A&P Medical Decision Making Problems Addressed: Infrarenal abdominal aortic aneurysm (AAA) without rupture: chronic illness or injury that poses a threat to life or bodily functions Ischemic colitis [facility]): acute illness or injury that poses a threat to life or bodily functions Rectal bleeding: acute illness or injury that poses a threat to life or bodily functions  Amount and/or Complexity of Data Reviewed Labs: ordered. Decision-making details documented in ED Course. Radiology: ordered and  independent interpretation performed. Decision-making details documented in ED Course.  Risk Prescription drug management. Decision regarding hospitalization.    Final Clinical Impression(s) / ED Diagnoses Final diagnoses:  Rectal bleeding  Ischemic colitis ([place])  Infrarenal abdominal aortic aneurysm (AAA) without rupture    Rx / DC Orders ED Discharge Orders     None        Truddie Hidden, MD 12/10/21 1336

## 2021-12-10 NOTE — H&P (Signed)
History and Physical  Kelli Deleon:[id number] DOB: [birth year] DOA: 12/10/2021   PCP: Kelli Deleon   Patient coming from: Home  Chief Complaint: abdominal pain and hematochezia  HPI:  Kelli Deleon is a 76 y.o. female with medical history of AAA, hypertension, hyperlipidemia, GERD, fibromyalgia presenting with abdominal pain and hematochezia that began around 3 PM on 12/09/2021.  Patient states that she had an episode of sharp and crampy lower abdominal pain around 3 PM on 12/09/2021.  This was followed by multiple episodes of hematochezia.  She states that abdominal pain has essentially been constant with episodes of severe cramping and sharp pain in between.  She states that she continued to have numerous episodes of sharp intermittent cramping with hematochezia throughout the evening as well as in the morning of 12/10/2021.  As result, the patient presented for further evaluation.  She did complain of some dizziness, but denied any chest pain, shortness breath, syncope.  She did have 1 episode of nausea and vomiting.  She has some subjective fevers and chills.  She denies any hemoptysis, dysuria, hematuria.  She denies NSAID use.  She only takes ASA 81 mg couple times per month. ED In the emergency department, patient was afebrile and hemodynamically stable with oxygen saturation 95-96% on room air.  BMP showed a sodium 136, potassium 3.9, bicarbonate 23, serum creatinine 1.13.  AST 53, ALT 42, alkaline phosphatase 74, total bilirubin 0.6.  WBC 11.9, hemoglobin 14.8, platelets 333,000.  UA was negative for pyuria.  CT abdomen and pelvis was negative for any evidence of active GI bleed.  There was colitis of the descending and sigmoid colon.  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  There is a stable 4.4 cm AAA.  FOBT was positive and showed bright red blood.  Assessment/Plan: Hematochezia/abdominal pain -Presentation consistent with ischemic colitis -Clear  liquid diet -GI consult -Monitor H&H  Essential hypertension -Holding amlodipine and monitor clinically  Fibromyalgia -Continue Cymbalta, Robaxin, Flexeril  Hyperlipidemia -Continue statin  GERD -Continue pantoprazole  Tobacco abuse -Patient has at least 75-pack-year history -Continues to smoke 1-1/2 packs/day -NicoDerm patch  AAA -Patient follows with vascular surgery, Dr. Donnetta Hutching -Continue surveillance CT scans as outpatient         Past Medical History:  Diagnosis Date   GERD (gastroesophageal reflux disease)    High cholesterol    Hypertension    Past Surgical History:  Procedure Laterality Date   ABDOMINAL HYSTERECTOMY     CATARACT EXTRACTION W/PHACO Right 06/22/2019   Procedure: CATARACT EXTRACTION PHACO AND INTRAOCULAR LENS PLACEMENT (IOC);  Surgeon: Baruch Goldmann, MD;  Location: [facility];  Service: Ophthalmology;  Laterality: Right;  CDE: 6.30   CATARACT EXTRACTION W/PHACO Left 07/06/2019   Procedure: CATARACT EXTRACTION PHACO AND INTRAOCULAR LENS PLACEMENT (IOC);  Surgeon: Baruch Goldmann, MD;  Location: [facility];  Service: Ophthalmology;  Laterality: Left;  left, CDE: 4.88   STAPEDES SURGERY Bilateral    1975 and #2 in 1990's   Social History:  reports that she has been smoking cigarettes. She has a 120.00 pack-year smoking history. She has never used smokeless tobacco. She reports that she does not drink alcohol and does not use drugs.   Family History  Problem Relation Age of Onset   Cancer Mother        lung   Tuberculosis Mother    Tuberculosis Father    Stroke Father    COPD Sister  Hypertension Sister    Anxiety disorder Sister    Hypertension Sister    Heart disease Brother    Hypertension Brother    Atrial fibrillation Brother    Neuropathy Brother    Cancer Brother    Cancer Brother    Cancer Brother    Kidney disease Brother    Lung disease Brother    Heart attack Brother    Pneumonia Brother    Heart attack Brother       Allergies  Allergen Reactions   Hydrocodone-Acetaminophen Hives and Itching   Carbamazepine Other (See Comments)    Fatigue, intolerance, trigeminal neuralgia   Gabapentin Itching and Nausea And Vomiting    Headache   Lisinopril Cough    aching, musculoskeletal   Morphine Nausea And Vomiting   Pregabalin Swelling     Prior to Admission medications   Medication Sig Start Date End Date Taking? Authorizing Provider  amLODipine (NORVASC) 5 MG tablet Take 5 mg by mouth every evening.  12/30/17  Yes [provider]  aspirin EC 81 MG tablet Take 81-162 mg by mouth daily as needed (chest pain).    Yes [provider]  BIOTIN PO Take 1 tablet by mouth daily.   Yes [provider]  Cholecalciferol (VITAMIN D3) 1000 units CAPS Take 1,000 Units by mouth daily.    Yes [provider]  cyclobenzaprine (FLEXERIL) 10 MG tablet Take 10 mg by mouth at bedtime. 11/14/21  Yes [provider]  DULoxetine (CYMBALTA) 30 MG capsule Take 30 mg by mouth every evening.  12/29/17  Yes [provider]  methocarbamol (ROBAXIN) 500 MG tablet Take 500 mg by mouth 2 (two) times daily. 11/25/21  Yes [provider]  Multiple Vitamins-Minerals (CENTRUM SILVER 50+WOMEN PO) Take 1 tablet by mouth daily.   Yes [provider]  pantoprazole (PROTONIX) 40 MG tablet Take 40 mg by mouth daily as needed (acid reflux).  12/29/17  Yes [provider]  phenylephrine (SUDAFED PE) 10 MG TABS tablet Take 10 mg by mouth every 6 (six) hours as needed (sinus headaches).   Yes [provider]  rosuvastatin (CRESTOR) 10 MG tablet Take 10 mg by mouth 3 (three) times a week. Mon, wed, fri 01/02/18  Yes [provider]    Review of Systems:  Constitutional:  No weight loss, night sweats,  Head&Eyes: No headache.  No vision loss.  No eye pain or scotoma ENT:  No Difficulty swallowing,Tooth/dental problems,Sore throat,  No ear ache, post  nasal drip,  Cardio-vascular:  No chest pain, Orthopnea, PND, swelling in lower extremities,  dizziness, palpitations  GI:  No   diarrhea, loss of appetite,melena, heartburn, indigestion, Resp:  No shortness of breath with exertion or at rest. No cough. No coughing up of blood .No wheezing.No chest wall deformity  Skin:  no rash or lesions.  GU:  no dysuria, change in color of urine, no urgency or frequency. No flank pain.  Musculoskeletal:  No joint pain or swelling. No decreased range of motion. No back pain.  Psych:  No change in mood or affect. No depression or anxiety. Neurologic: No headache, no dysesthesia, no focal weakness, no vision loss. No syncope  Physical Exam: Vitals:   12/10/21 1100 12/10/21 1130 12/10/21 1308 12/10/21 1309  BP: 140/66 (!) 154/70 139/77   Pulse: 71 70  67  Resp: 19 18  14   Temp:      SpO2: 96% 93%  95%  Weight:  Height:       General:  A&O x 3, NAD, nontoxic, pleasant/cooperative Head/Eye: No conjunctival hemorrhage, no icterus, [place]/AT, No nystagmus ENT:  No icterus,  No thrush, good dentition, no pharyngeal exudate Neck:  No masses, no lymphadenpathy, no bruits CV:  RRR, no rub, no gallop, no S3 Lung: Diminished breath sounds at the bases but CTAB, good air movement, no wheeze, no rhonchi Abdomen: soft/mild lower abdominal pain, +BS, nondistended, no peritoneal signs Ext: No cyanosis, No rashes, No petechiae, No lymphangitis, No edema Neuro: CNII-XII intact, strength 4/5 in bilateral upper and lower extremities, no dysmetria  Labs on Admission:  Basic Metabolic Panel: Recent Labs  Lab 12/10/21 0916  NA 136  K 3.9  CL 102  CO2 23  GLUCOSE 107*  BUN 12  CREATININE 1.13*  CALCIUM 9.2   Liver Function Tests: Recent Labs  Lab 12/10/21 0916  AST 53*  ALT 42  ALKPHOS 74  BILITOT 0.6  PROT 7.5  ALBUMIN 4.3   No results for input(s): LIPASE, AMYLASE in the last 168 hours. No results for input(s): AMMONIA in the last 168  hours. CBC: Recent Labs  Lab 12/10/21 0916  WBC 11.9*  NEUTROABS 8.0*  HGB 14.8  HCT 45.0  MCV 90.4  PLT 333   Coagulation Profile: Recent Labs  Lab 12/10/21 0916  INR 1.0   Cardiac Enzymes: No results for input(s): CKTOTAL, CKMB, CKMBINDEX, TROPONINI in the last 168 hours. BNP: Invalid input(s): POCBNP CBG: No results for input(s): GLUCAP in the last 168 hours. Urine analysis:    Component Value Date/Time   COLORURINE STRAW (A) 12/10/2021 0916   APPEARANCEUR CLEAR 12/10/2021 0916   LABSPEC 1.018 12/10/2021 0916   PHURINE 7.0 12/10/2021 0916   GLUCOSEU NEGATIVE 12/10/2021 0916   HGBUR SMALL (A) 12/10/2021 0916   BILIRUBINUR NEGATIVE 12/10/2021 0916   KETONESUR NEGATIVE 12/10/2021 0916   PROTEINUR NEGATIVE 12/10/2021 0916   NITRITE NEGATIVE 12/10/2021 0916   LEUKOCYTESUR NEGATIVE 12/10/2021 0916   Sepsis Labs: @LABRCNTIP (procalcitonin:4,lacticidven:4) )No results found for this or any previous visit (from the past 240 hour(s)).   Radiological Exams on Admission: CT Angio Abd/Pel W and/or Wo Contrast  Result Date: 12/10/2021 CLINICAL DATA:  Abdominal pain, lower GI bleed EXAM: CTA ABDOMEN AND PELVIS WITHOUT AND WITH CONTRAST TECHNIQUE: Multidetector CT imaging of the abdomen and pelvis was performed using the standard protocol during bolus administration of intravenous contrast. Multiplanar reconstructed images and MIPs were obtained and reviewed to evaluate the vascular anatomy. RADIATION DOSE REDUCTION: This exam was performed according to the departmental dose-optimization program which includes automated exposure control, adjustment of the mA and/or kV according to patient size and/or use of iterative reconstruction technique. CONTRAST:  118mL OMNIPAQUE IOHEXOL 350 MG/ML SOLN COMPARISON:  CTA 11/12/2020 FINDINGS: VASCULAR Aorta: Extensive calcified atheromatous plaque. Fusiform infrarenal aneurysm measuring 4.4 x 4.1 cm maximum transverse diameter (previously 4.3).  There is a curvilinear partially calcified intraluminal flap as before probably related to right lateral penetrating atheromatous ulcer. No evidence of leak or impending rupture. Celiac: Patent without evidence of aneurysm, dissection, vasculitis or significant stenosis. SMA: Patent without evidence of aneurysm, dissection, vasculitis or significant stenosis. Replaced right hepatic arterial supply, an anatomic variant. Renals: Duplicated left, superior dominant, with calcified ostial plaque resulting in short segment stenosis of at least mild severity, patent distally. Duplicated right, inferior dominant, both patent. IMA: Short-segment origin occlusion, reconstituted distally by visceral collaterals. Inflow: Scattered calcified atheromatous plaque. No aneurysm, dissection, or stenosis. Proximal Outflow: Mildly atheromatous,  patent Veins: Patent hepatic veins, portal vein, SM V, splenic vein, bilateral renal veins, iliac venous system and IVC. No venous pathology evident. Review of the MIP images confirms the above findings. NON-VASCULAR Lower chest: Coronary calcifications. No pleural or pericardial effusion. Hepatobiliary: No focal liver abnormality is seen. No gallstones, gallbladder wall thickening, or biliary dilatation. Pancreas: Unremarkable. No pancreatic ductal dilatation or surrounding inflammatory changes. Spleen: Normal in size without focal abnormality. Adrenals/Urinary Tract: No adrenal mass. Multiple cortical lesions are again seen throughout both kidneys, some of which can be characterized as cysts. No urolithiasis or hydronephrosis. Urinary bladder is physiologically distended. Stomach/Bowel: Stomach is incompletely distended, unremarkable. Small bowel decompressed. Appendix not identified. The colon is nondilated. Circumferential wall thickening in the distal descending and proximal sigmoid colon with mild adjacent regional inflammatory change. No focus of active extravasation. A few scattered  sigmoid diverticula containing hyperdense material. Lymphatic: No abdominal or pelvic adenopathy. Reproductive: Status post hysterectomy. No adnexal masses. Other: No ascites.  No free air. Musculoskeletal: No acute or significant osseous findings. IMPRESSION: 1. No  evidence of active GI bleed. 2. Colitis involving the descending and sigmoid colon, new since prior study. 3. Celiac axis and superior mesenteric artery are widely patent. Chronic proximal IMA occlusion. 4. Stable 4.4 cm infrarenal abdominal aortic aneurysm. Recommend follow-up every 12 months and vascular consultation. This recommendation follows ACR consensus guidelines: White Paper of the ACR Incidental Findings Committee II on Vascular Findings. J Am Coll Radiol 2013; 10:789-794. Critical Value/emergent results were called by telephone at the time of interpretation on 12/10/2021 at 12:35 pm to provider [facility] , who verbally acknowledged these results. Electronically Signed   By: Lucrezia Europe M.D.   On: 12/10/2021 12:35       Time spent:60 minutes Code Status:   FULL Family Communication:  spouse updated at bedside 1/26 Disposition Plan: expect 1-2 day hospitalization Consults called: GI  DVT Prophylaxis: SCDs  Orson Eva, DO  [facility] Pager [phone]  If 7PM-7AM, please contact night-coverage [email or web address] Password [facility] 12/10/2021, 1:43 PM

## 2021-12-10 NOTE — ED Triage Notes (Signed)
Patient complaining of abdominal pain that moves into the back with with rectal bleeding.

## 2021-12-11 ENCOUNTER — Telehealth: Payer: Self-pay | Admitting: Gastroenterology

## 2021-12-11 DIAGNOSIS — K625 Hemorrhage of anus and rectum: Secondary | ICD-10-CM | POA: Diagnosis not present

## 2021-12-11 DIAGNOSIS — K559 Vascular disorder of intestine, unspecified: Secondary | ICD-10-CM | POA: Diagnosis not present

## 2021-12-11 DIAGNOSIS — Z720 Tobacco use: Secondary | ICD-10-CM | POA: Diagnosis not present

## 2021-12-11 DIAGNOSIS — I714 Abdominal aortic aneurysm, without rupture, unspecified: Secondary | ICD-10-CM | POA: Diagnosis not present

## 2021-12-11 DIAGNOSIS — I10 Essential (primary) hypertension: Secondary | ICD-10-CM | POA: Diagnosis not present

## 2021-12-11 LAB — CBC
HCT: 43.4 % (ref 36.0–46.0)
Hemoglobin: 14.5 g/dL (ref 12.0–15.0)
MCH: 31 pg (ref 26.0–34.0)
MCHC: 33.4 g/dL (ref 30.0–36.0)
MCV: 92.9 fL (ref 80.0–100.0)
Platelets: 290 10*3/uL (ref 150–400)
RBC: 4.67 MIL/uL (ref 3.87–5.11)
RDW: 13.9 % (ref 11.5–15.5)
WBC: 10.8 10*3/uL — ABNORMAL HIGH (ref 4.0–10.5)
nRBC: 0 % (ref 0.0–0.2)

## 2021-12-11 LAB — COMPREHENSIVE METABOLIC PANEL
ALT: 38 U/L (ref 0–44)
AST: 58 U/L — ABNORMAL HIGH (ref 15–41)
Albumin: 3.7 g/dL (ref 3.5–5.0)
Alkaline Phosphatase: 67 U/L (ref 38–126)
Anion gap: 12 (ref 5–15)
BUN: 9 mg/dL (ref 8–23)
CO2: 27 mmol/L (ref 22–32)
Calcium: 8.5 mg/dL — ABNORMAL LOW (ref 8.9–10.3)
Chloride: 103 mmol/L (ref 98–111)
Creatinine, Ser: 1.09 mg/dL — ABNORMAL HIGH (ref 0.44–1.00)
GFR, Estimated: 54 mL/min — ABNORMAL LOW (ref >60)
Glucose, Bld: 104 mg/dL — ABNORMAL HIGH (ref 70–99)
Potassium: 4 mmol/L (ref 3.5–5.1)
Sodium: 142 mmol/L (ref 135–145)
Total Bilirubin: 0.5 mg/dL (ref 0.3–1.2)
Total Protein: 6.8 g/dL (ref 6.5–8.1)

## 2021-12-11 LAB — MAGNESIUM: Magnesium: 2.1 mg/dL (ref 1.7–2.4)

## 2021-12-11 NOTE — Discharge Summary (Signed)
Physician Discharge Summary  Kelli Deleon MPN:[id number] DOB: July 07, 1946 DOA: 12/10/2021  PCP: Nicanor Bake C  Admit date: 12/10/2021 Discharge date: 12/11/2021  Admitted From: Home Disposition:  Home   Recommendations for Outpatient Follow-up:  Follow up with PCP in 1-2 weeks Please obtain BMP/CBC in one week     Discharge Condition: Stable CODE STATUS:FULL Diet recommendation: Heart Healthy / Carb Modified    Brief/Interim Summary:  76 y.o. female with medical history of AAA, hypertension, hyperlipidemia, GERD, fibromyalgia presenting with abdominal pain and hematochezia that began around 3 PM on 12/09/2021.  Patient states that she had an episode of sharp and crampy lower abdominal pain around 3 PM on 12/09/2021.  This was followed by multiple episodes of hematochezia.  She states that abdominal pain has essentially been constant with episodes of severe cramping and sharp pain in between.  She states that she continued to have numerous episodes of sharp intermittent cramping with hematochezia throughout the evening as well as in the morning of 12/10/2021.  As result, the patient presented for further evaluation.  She did complain of some dizziness, but denied any chest pain, shortness breath, syncope.  She did have 1 episode of nausea and vomiting.  She has some subjective fevers and chills.  She denies any hemoptysis, dysuria, hematuria.  She denies NSAID use.  She only takes ASA 81 mg couple times per month. ED In the emergency department, patient was afebrile and hemodynamically stable with oxygen saturation 95-96% on room air.  BMP showed a sodium 136, potassium 3.9, bicarbonate 23, serum creatinine 1.13.  AST 53, ALT 42, alkaline phosphatase 74, total bilirubin 0.6.  WBC 11.9, hemoglobin 14.8, platelets 333,000.  UA was negative for pyuria.  CT abdomen and pelvis was negative for any evidence of active GI bleed.  There was colitis of the descending and sigmoid colon.  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  There is a stable 4.4 cm AAA.  FOBT was positive and showed bright red blood.  GI was consulted to assist with management    Discharge Diagnoses:  Hematochezia/abdominal pain/Ischemic Colitis -Presentation consistent with ischemic colitis -Clear liquid diet>>advanced to soft diet which patient tolerated -GI consult appreciated>>continued closed observation and bowel rest initially -Hgb remained stable--14.5 on day of d/c -hematochezia resolved within 24 hours -abd pain improved and diet advanced -1/27--GI cleared patient for d/c   Essential hypertension -Holding amlodipine and monitor clinically -restart amlodipine after d/c   Fibromyalgia -Continue Cymbalta, Robaxin, Flexeril   Hyperlipidemia -Continue statin   GERD -Continue pantoprazole   Tobacco abuse -Patient has at least 75-pack-year history -Continues to smoke 1-1/2 packs/day -NicoDerm patch   AAA -Patient follows with vascular surgery, Dr. Donnetta Hutching -Continue surveillance CT scans as outpatient   Discharge Instructions   Allergies as of 12/11/2021       Reactions   Hydrocodone-acetaminophen Hives, Itching   Carbamazepine Other (See Comments)   Fatigue, intolerance, trigeminal neuralgia   Gabapentin Itching, Nausea And Vomiting   Headache   Lisinopril Cough   aching, musculoskeletal   Morphine Nausea And Vomiting   Pregabalin Swelling        Medication List     TAKE these medications    amLODipine 5 MG tablet Commonly known as: NORVASC Take 5 mg by mouth every evening.   aspirin EC 81 MG tablet Take 81-162 mg by mouth daily as needed (chest pain).   BIOTIN PO Take 1 tablet by mouth daily.   CENTRUM  SILVER 50+WOMEN PO Take 1 tablet by mouth daily.   cyclobenzaprine 10 MG tablet Commonly known as: FLEXERIL Take 10 mg by mouth at bedtime.   DULoxetine 30 MG capsule Commonly known as: CYMBALTA Take 30 mg by mouth every evening.    methocarbamol 500 MG tablet Commonly known as: ROBAXIN Take 500 mg by mouth 2 (two) times daily.   pantoprazole 40 MG tablet Commonly known as: PROTONIX Take 40 mg by mouth daily as needed (acid reflux).   phenylephrine 10 MG Tabs tablet Commonly known as: SUDAFED PE Take 10 mg by mouth every 6 (six) hours as needed (sinus headaches).   rosuvastatin 10 MG tablet Commonly known as: CRESTOR Take 10 mg by mouth 3 (three) times a week. Mon, wed, fri   Vitamin D3 25 MCG (1000 UT) Caps Take 1,000 Units by mouth daily.        Allergies  Allergen Reactions   Hydrocodone-Acetaminophen Hives and Itching   Carbamazepine Other (See Comments)    Fatigue, intolerance, trigeminal neuralgia   Gabapentin Itching and Nausea And Vomiting    Headache   Lisinopril Cough    aching, musculoskeletal   Morphine Nausea And Vomiting   Pregabalin Swelling    Consultations: GI   Procedures/Studies: CT Angio Abd/Pel W and/or Wo Contrast  Result Date: 12/10/2021 CLINICAL DATA:  Abdominal pain, lower GI bleed EXAM: CTA ABDOMEN AND PELVIS WITHOUT AND WITH CONTRAST TECHNIQUE: Multidetector CT imaging of the abdomen and pelvis was performed using the standard protocol during bolus administration of intravenous contrast. Multiplanar reconstructed images and MIPs were obtained and reviewed to evaluate the vascular anatomy. RADIATION DOSE REDUCTION: This exam was performed according to the departmental dose-optimization program which includes automated exposure control, adjustment of the mA and/or kV according to patient size and/or use of iterative reconstruction technique. CONTRAST:  171mL OMNIPAQUE IOHEXOL 350 MG/ML SOLN COMPARISON:  CTA 11/12/2020 FINDINGS: VASCULAR Aorta: Extensive calcified atheromatous plaque. Fusiform infrarenal aneurysm measuring 4.4 x 4.1 cm maximum transverse diameter (previously 4.3). There is a curvilinear partially calcified intraluminal flap as before probably related to right  lateral penetrating atheromatous ulcer. No evidence of leak or impending rupture. Celiac: Patent without evidence of aneurysm, dissection, vasculitis or significant stenosis. SMA: Patent without evidence of aneurysm, dissection, vasculitis or significant stenosis. Replaced right hepatic arterial supply, an anatomic variant. Renals: Duplicated left, superior dominant, with calcified ostial plaque resulting in short segment stenosis of at least mild severity, patent distally. Duplicated right, inferior dominant, both patent. IMA: Short-segment origin occlusion, reconstituted distally by visceral collaterals. Inflow: Scattered calcified atheromatous plaque. No aneurysm, dissection, or stenosis. Proximal Outflow: Mildly atheromatous, patent Veins: Patent hepatic veins, portal vein, SM V, splenic vein, bilateral renal veins, iliac venous system and IVC. No venous pathology evident. Review of the MIP images confirms the above findings. NON-VASCULAR Lower chest: Coronary calcifications. No pleural or pericardial effusion. Hepatobiliary: No focal liver abnormality is seen. No gallstones, gallbladder wall thickening, or biliary dilatation. Pancreas: Unremarkable. No pancreatic ductal dilatation or surrounding inflammatory changes. Spleen: Normal in size without focal abnormality. Adrenals/Urinary Tract: No adrenal mass. Multiple cortical lesions are again seen throughout both kidneys, some of which can be characterized as cysts. No urolithiasis or hydronephrosis. Urinary bladder is physiologically distended. Stomach/Bowel: Stomach is incompletely distended, unremarkable. Small bowel decompressed. Appendix not identified. The colon is nondilated. Circumferential wall thickening in the distal descending and proximal sigmoid colon with mild adjacent regional inflammatory change. No focus of active extravasation. A few scattered sigmoid diverticula containing hyperdense  material. Lymphatic: No abdominal or pelvic adenopathy.  Reproductive: Status post hysterectomy. No adnexal masses. Other: No ascites.  No free air. Musculoskeletal: No acute or significant osseous findings. IMPRESSION: 1. No  evidence of active GI bleed. 2. Colitis involving the descending and sigmoid colon, new since prior study. 3. Celiac axis and superior mesenteric artery are widely patent. Chronic proximal IMA occlusion. 4. Stable 4.4 cm infrarenal abdominal aortic aneurysm. Recommend follow-up every 12 months and vascular consultation. This recommendation follows ACR consensus guidelines: White Paper of the ACR Incidental Findings Committee II on Vascular Findings. J Am Coll Radiol 2013; 10:789-794. Critical Value/emergent results were called by telephone at the time of interpretation on 12/10/2021 at 12:35 pm to provider [facility] , who verbally acknowledged these results. Electronically Signed   By: Lucrezia Europe M.D.   On: 12/10/2021 12:35        Discharge Exam: Vitals:   12/11/21 0412 12/11/21 1231  BP: 132/78 (!) 142/58  Pulse: 69 69  Resp: 18 17  Temp: 97.7 F (36.5 C) 98.2 F (36.8 C)  SpO2: 94% 97%   Vitals:   12/10/21 1649 12/10/21 2100 12/11/21 0412 12/11/21 1231  BP: (!) 143/71 140/75 132/78 (!) 142/58  Pulse: 63 90 69 69  Resp: 20 19 18 17   Temp: 97.9 F (36.6 C) 97.7 F (36.5 C) 97.7 F (36.5 C) 98.2 F (36.8 C)  TempSrc: Oral Oral    SpO2: 98%  94% 97%  Weight: 83.4 kg     Height: 5\' 5"  (1.651 m)       General: Pt is alert, awake, not in acute distress Cardiovascular: RRR, S1/S2 +, no rubs, no gallops Respiratory: CTA bilaterally, no wheezing, no rhonchi Abdominal: Soft, NT, ND, bowel sounds + Extremities: no edema, no cyanosis   The results of significant diagnostics from this hospitalization (including imaging, microbiology, ancillary and laboratory) are listed below for reference.    Significant Diagnostic Studies: CT Angio Abd/Pel W and/or Wo Contrast  Result Date: 12/10/2021 CLINICAL DATA:  Abdominal  pain, lower GI bleed EXAM: CTA ABDOMEN AND PELVIS WITHOUT AND WITH CONTRAST TECHNIQUE: Multidetector CT imaging of the abdomen and pelvis was performed using the standard protocol during bolus administration of intravenous contrast. Multiplanar reconstructed images and MIPs were obtained and reviewed to evaluate the vascular anatomy. RADIATION DOSE REDUCTION: This exam was performed according to the departmental dose-optimization program which includes automated exposure control, adjustment of the mA and/or kV according to patient size and/or use of iterative reconstruction technique. CONTRAST:  132mL OMNIPAQUE IOHEXOL 350 MG/ML SOLN COMPARISON:  CTA 11/12/2020 FINDINGS: VASCULAR Aorta: Extensive calcified atheromatous plaque. Fusiform infrarenal aneurysm measuring 4.4 x 4.1 cm maximum transverse diameter (previously 4.3). There is a curvilinear partially calcified intraluminal flap as before probably related to right lateral penetrating atheromatous ulcer. No evidence of leak or impending rupture. Celiac: Patent without evidence of aneurysm, dissection, vasculitis or significant stenosis. SMA: Patent without evidence of aneurysm, dissection, vasculitis or significant stenosis. Replaced right hepatic arterial supply, an anatomic variant. Renals: Duplicated left, superior dominant, with calcified ostial plaque resulting in short segment stenosis of at least mild severity, patent distally. Duplicated right, inferior dominant, both patent. IMA: Short-segment origin occlusion, reconstituted distally by visceral collaterals. Inflow: Scattered calcified atheromatous plaque. No aneurysm, dissection, or stenosis. Proximal Outflow: Mildly atheromatous, patent Veins: Patent hepatic veins, portal vein, SM V, splenic vein, bilateral renal veins, iliac venous system and IVC. No venous pathology evident. Review of the MIP images confirms the above findings.  NON-VASCULAR Lower chest: Coronary calcifications. No pleural or  pericardial effusion. Hepatobiliary: No focal liver abnormality is seen. No gallstones, gallbladder wall thickening, or biliary dilatation. Pancreas: Unremarkable. No pancreatic ductal dilatation or surrounding inflammatory changes. Spleen: Normal in size without focal abnormality. Adrenals/Urinary Tract: No adrenal mass. Multiple cortical lesions are again seen throughout both kidneys, some of which can be characterized as cysts. No urolithiasis or hydronephrosis. Urinary bladder is physiologically distended. Stomach/Bowel: Stomach is incompletely distended, unremarkable. Small bowel decompressed. Appendix not identified. The colon is nondilated. Circumferential wall thickening in the distal descending and proximal sigmoid colon with mild adjacent regional inflammatory change. No focus of active extravasation. A few scattered sigmoid diverticula containing hyperdense material. Lymphatic: No abdominal or pelvic adenopathy. Reproductive: Status post hysterectomy. No adnexal masses. Other: No ascites.  No free air. Musculoskeletal: No acute or significant osseous findings. IMPRESSION: 1. No  evidence of active GI bleed. 2. Colitis involving the descending and sigmoid colon, new since prior study. 3. Celiac axis and superior mesenteric artery are widely patent. Chronic proximal IMA occlusion. 4. Stable 4.4 cm infrarenal abdominal aortic aneurysm. Recommend follow-up every 12 months and vascular consultation. This recommendation follows ACR consensus guidelines: White Paper of the ACR Incidental Findings Committee II on Vascular Findings. J Am Coll Radiol 2013; 10:789-794. Critical Value/emergent results were called by telephone at the time of interpretation on 12/10/2021 at 12:35 pm to provider [facility] , who verbally acknowledged these results. Electronically Signed   By: Lucrezia Europe M.D.   On: 12/10/2021 12:35    Microbiology: No results found for this or any previous visit (from the past 240 hour(s)).    Labs: Basic Metabolic Panel: Recent Labs  Lab 12/10/21 0916 12/11/21 0451  NA 136 142  K 3.9 4.0  CL 102 103  CO2 23 27  GLUCOSE 107* 104*  BUN 12 9  CREATININE 1.13* 1.09*  CALCIUM 9.2 8.5*  MG  --  2.1   Liver Function Tests: Recent Labs  Lab 12/10/21 0916 12/11/21 0451  AST 53* 58*  ALT 42 38  ALKPHOS 74 67  BILITOT 0.6 0.5  PROT 7.5 6.8  ALBUMIN 4.3 3.7   No results for input(s): LIPASE, AMYLASE in the last 168 hours. No results for input(s): AMMONIA in the last 168 hours. CBC: Recent Labs  Lab 12/10/21 0916 12/11/21 0451  WBC 11.9* 10.8*  NEUTROABS 8.0*  --   HGB 14.8 14.5  HCT 45.0 43.4  MCV 90.4 92.9  PLT 333 290   Cardiac Enzymes: No results for input(s): CKTOTAL, CKMB, CKMBINDEX, TROPONINI in the last 168 hours. BNP: Invalid input(s): POCBNP CBG: No results for input(s): GLUCAP in the last 168 hours.  Time coordinating discharge:  36 minutes  Signed:  Orson Eva, DO [facility] Pager: [phone] 12/11/2021, 2:36 PM

## 2021-12-11 NOTE — Care Management Obs Status (Signed)
[place] NOTIFICATION   Patient Details  Name: Kelli Deleon MRN: [id number] Date of Birth: 10-15-1946   Medicare Observation Status Notification Given:  Yes    Tommy Medal 12/11/2021, 2:35 PM

## 2021-12-11 NOTE — Telephone Encounter (Signed)
Patient needs hospital follow-up of ischemic colitis in 2 to 4 weeks with APP.  Please arrange.

## 2021-12-11 NOTE — Progress Notes (Signed)
Subjective: Feeling much better. Bleeding slowly tapered off. She passed a very small amount of blood around 4 am. She has since had 2 regular BMs without blood today. Abdominal pain has essentially resolved.  Occasionally, she has a twinge of discomfort in her left lower quadrant, but nothing persistent and nothing sharp like her prior pain.  No nausea or vomiting.  Wanting to go home.  Objective: Vital signs in last 24 hours: Temp:  [97.7 F (36.5 C)-97.9 F (36.6 C)] 97.7 F (36.5 C) (01/27 0412) Pulse Rate:  [63-90] 69 (01/27 0412) Resp:  [14-20] 18 (01/27 0412) BP: (132-143)/(71-78) 132/78 (01/27 0412) SpO2:  [94 %-98 %] 94 % (01/27 0412) Weight:  [81.6 kg-83.4 kg] 83.4 kg (01/26 1649) Last BM Date: 12/10/21 General:   Alert and oriented, pleasant, no acute distress. Head:  Normocephalic and atraumatic. Eyes:  No icterus, sclera clear. Conjuctiva pink.  Abdomen:  Bowel sounds present, soft, non-tender, non-distended. No HSM or hernias noted. No rebound or guarding. No masses appreciated  Msk: Symmetrical without gross deformities. Normal posture. Extremities:  Without edema. Neurologic:  Alert and  oriented x4;  grossly normal neurologically. Skin:  Warm and dry, intact without significant lesions.  Cervical Nodes:  No significant cervical adenopathy. Psych: Normal mood and affect.  Intake/Output from previous day: 01/26 0701 - 01/27 0700 In: 2337.1 [P.O.:840; I.V.:497.1; IV Piggyback:1000] Out: -  Intake/Output this shift: Total I/O In: 490 [P.O.:240; I.V.:250] Out: -   Lab Results: Recent Labs    12/10/21 0916 12/11/21 0451  WBC 11.9* 10.8*  HGB 14.8 14.5  HCT 45.0 43.4  PLT 333 290   BMET Recent Labs    12/10/21 0916 12/11/21 0451  NA 136 142  K 3.9 4.0  CL 102 103  CO2 23 27  GLUCOSE 107* 104*  BUN 12 9  CREATININE 1.13* 1.09*  CALCIUM 9.2 8.5*   LFT Recent Labs    12/10/21 0916 12/11/21 0451  PROT 7.5 6.8  ALBUMIN 4.3 3.7  AST 53* 58*   ALT 42 38  ALKPHOS 74 67  BILITOT 0.6 0.5   PT/INR Recent Labs    12/10/21 0916  LABPROT 13.4  INR 1.0    Studies/Results: CT Angio Abd/Pel W and/or Wo Contrast  Result Date: 12/10/2021 CLINICAL DATA:  Abdominal pain, lower GI bleed EXAM: CTA ABDOMEN AND PELVIS WITHOUT AND WITH CONTRAST TECHNIQUE: Multidetector CT imaging of the abdomen and pelvis was performed using the standard protocol during bolus administration of intravenous contrast. Multiplanar reconstructed images and MIPs were obtained and reviewed to evaluate the vascular anatomy. RADIATION DOSE REDUCTION: This exam was performed according to the departmental dose-optimization program which includes automated exposure control, adjustment of the mA and/or kV according to patient size and/or use of iterative reconstruction technique. CONTRAST:  150mL OMNIPAQUE IOHEXOL 350 MG/ML SOLN COMPARISON:  CTA 11/12/2020 FINDINGS: VASCULAR Aorta: Extensive calcified atheromatous plaque. Fusiform infrarenal aneurysm measuring 4.4 x 4.1 cm maximum transverse diameter (previously 4.3). There is a curvilinear partially calcified intraluminal flap as before probably related to right lateral penetrating atheromatous ulcer. No evidence of leak or impending rupture. Celiac: Patent without evidence of aneurysm, dissection, vasculitis or significant stenosis. SMA: Patent without evidence of aneurysm, dissection, vasculitis or significant stenosis. Replaced right hepatic arterial supply, an anatomic variant. Renals: Duplicated left, superior dominant, with calcified ostial plaque resulting in short segment stenosis of at least mild severity, patent distally. Duplicated right, inferior dominant, both patent. IMA: Short-segment origin occlusion, reconstituted distally by visceral  collaterals. Inflow: Scattered calcified atheromatous plaque. No aneurysm, dissection, or stenosis. Proximal Outflow: Mildly atheromatous, patent Veins: Patent hepatic veins, portal vein,  SM V, splenic vein, bilateral renal veins, iliac venous system and IVC. No venous pathology evident. Review of the MIP images confirms the above findings. NON-VASCULAR Lower chest: Coronary calcifications. No pleural or pericardial effusion. Hepatobiliary: No focal liver abnormality is seen. No gallstones, gallbladder wall thickening, or biliary dilatation. Pancreas: Unremarkable. No pancreatic ductal dilatation or surrounding inflammatory changes. Spleen: Normal in size without focal abnormality. Adrenals/Urinary Tract: No adrenal mass. Multiple cortical lesions are again seen throughout both kidneys, some of which can be characterized as cysts. No urolithiasis or hydronephrosis. Urinary bladder is physiologically distended. Stomach/Bowel: Stomach is incompletely distended, unremarkable. Small bowel decompressed. Appendix not identified. The colon is nondilated. Circumferential wall thickening in the distal descending and proximal sigmoid colon with mild adjacent regional inflammatory change. No focus of active extravasation. A few scattered sigmoid diverticula containing hyperdense material. Lymphatic: No abdominal or pelvic adenopathy. Reproductive: Status post hysterectomy. No adnexal masses. Other: No ascites.  No free air. Musculoskeletal: No acute or significant osseous findings. IMPRESSION: 1. No  evidence of active GI bleed. 2. Colitis involving the descending and sigmoid colon, new since prior study. 3. Celiac axis and superior mesenteric artery are widely patent. Chronic proximal IMA occlusion. 4. Stable 4.4 cm infrarenal abdominal aortic aneurysm. Recommend follow-up every 12 months and vascular consultation. This recommendation follows ACR consensus guidelines: White Paper of the ACR Incidental Findings Committee II on Vascular Findings. J Am Coll Radiol 2013; 10:789-794. Critical Value/emergent results were called by telephone at the time of interpretation on 12/10/2021 at 12:35 pm to provider [facility] , who verbally acknowledged these results. Electronically Signed   By: Lucrezia Europe M.D.   On: 12/10/2021 12:35    Assessment: 76 y.o. year old female presenting with clinical scenario most consistent with ischemic colitis. Hgb 14.8 on admission, and CTA  abd/pelvis without active GI bleeding. Colitis noted involving descending and sigmoid colon. Celiac and SMA widely patent. Chronic proximal IMA occlusion. Known stable abdominal aortic aneurysm.  She was admitted and treated with supportive measures, now clinically much improved.  Abdominal pain essentially resolved and rectal bleeding completely tapered off with last episode of small amount of bright red blood per rectum around 4 AM this morning.  She has since had 2 regular bowel movements.  Her hemoglobin has remained entirely stable at 14.5.   We will advance her diet. If she tolerates this well, she should be able to go home.   Plan: Advance to soft, low residue diet. Continue supportive measures as needed. Okay to discharge home from GI standpoint if she tolerates soft diet. Outpatient colonoscopy. We will arrange outpatient follow-up.   LOS: 0 days    12/11/2021, 12:02 PM   Aliene Altes, [facility]

## 2021-12-11 NOTE — TOC Progression Note (Signed)
Transition of Care [facility]) - Progression Note    Patient Details  Name: Kelli Deleon MRN: [id number] Date of Birth: [birth year]  Transition of Care [facility]) CM/SW Contact  Salome Arnt, [place] Phone Number: 12/11/2021, 10:34 AM  Clinical Narrative:   Transition of Care [facility]) Screening Note   Patient Details  Name: Kelli Deleon Date of Birth: 06/20/1946   Transition of Care [facility]) CM/SW Contact:    Salome Arnt, [place] Phone Number: 12/11/2021, 10:34 AM    Transition of Care Department [facility]) has reviewed patient and no TOC needs have been identified at this time. We will continue to monitor patient advancement through interdisciplinary progression rounds. If new patient transition needs arise, please place a TOC consult.         Barriers to Discharge: Continued Medical Work up  Expected Discharge Plan and Services                                                 Social Determinants of Health (SDOH) Interventions    Readmission Risk Interventions No flowsheet data found.

## 2021-12-16 DIAGNOSIS — Z6831 Body mass index (BMI) 31.0-31.9, adult: Secondary | ICD-10-CM | POA: Diagnosis not present

## 2021-12-16 DIAGNOSIS — F17210 Nicotine dependence, cigarettes, uncomplicated: Secondary | ICD-10-CM | POA: Diagnosis not present

## 2021-12-16 DIAGNOSIS — R69 Illness, unspecified: Secondary | ICD-10-CM | POA: Diagnosis not present

## 2021-12-16 DIAGNOSIS — Z09 Encounter for follow-up examination after completed treatment for conditions other than malignant neoplasm: Secondary | ICD-10-CM | POA: Diagnosis not present

## 2021-12-16 DIAGNOSIS — J439 Emphysema, unspecified: Secondary | ICD-10-CM | POA: Diagnosis not present

## 2021-12-16 DIAGNOSIS — I10 Essential (primary) hypertension: Secondary | ICD-10-CM | POA: Diagnosis not present

## 2021-12-16 DIAGNOSIS — K559 Vascular disorder of intestine, unspecified: Secondary | ICD-10-CM | POA: Diagnosis not present

## 2021-12-16 DIAGNOSIS — I719 Aortic aneurysm of unspecified site, without rupture: Secondary | ICD-10-CM | POA: Diagnosis not present

## 2021-12-29 DIAGNOSIS — Z7189 Other specified counseling: Secondary | ICD-10-CM | POA: Diagnosis not present

## 2021-12-29 DIAGNOSIS — Z6831 Body mass index (BMI) 31.0-31.9, adult: Secondary | ICD-10-CM | POA: Diagnosis not present

## 2021-12-29 DIAGNOSIS — F17210 Nicotine dependence, cigarettes, uncomplicated: Secondary | ICD-10-CM | POA: Diagnosis not present

## 2021-12-29 DIAGNOSIS — R5383 Other fatigue: Secondary | ICD-10-CM | POA: Diagnosis not present

## 2021-12-29 DIAGNOSIS — R69 Illness, unspecified: Secondary | ICD-10-CM | POA: Diagnosis not present

## 2021-12-29 DIAGNOSIS — Z1331 Encounter for screening for depression: Secondary | ICD-10-CM | POA: Diagnosis not present

## 2021-12-29 DIAGNOSIS — I10 Essential (primary) hypertension: Secondary | ICD-10-CM | POA: Diagnosis not present

## 2021-12-29 DIAGNOSIS — M810 Age-related osteoporosis without current pathological fracture: Secondary | ICD-10-CM | POA: Diagnosis not present

## 2021-12-29 DIAGNOSIS — Z0000 Encounter for general adult medical examination without abnormal findings: Secondary | ICD-10-CM | POA: Diagnosis not present

## 2021-12-29 DIAGNOSIS — Z79899 Other long term (current) drug therapy: Secondary | ICD-10-CM | POA: Diagnosis not present

## 2021-12-29 DIAGNOSIS — E7800 Pure hypercholesterolemia, unspecified: Secondary | ICD-10-CM | POA: Diagnosis not present

## 2021-12-29 DIAGNOSIS — Z299 Encounter for prophylactic measures, unspecified: Secondary | ICD-10-CM | POA: Diagnosis not present

## 2021-12-29 DIAGNOSIS — Z1339 Encounter for screening examination for other mental health and behavioral disorders: Secondary | ICD-10-CM | POA: Diagnosis not present

## 2022-03-05 ENCOUNTER — Other Ambulatory Visit: Payer: Self-pay | Admitting: Gastroenterology

## 2022-03-05 ENCOUNTER — Ambulatory Visit: Payer: Medicare HMO | Admitting: Gastroenterology

## 2022-03-05 ENCOUNTER — Encounter: Payer: Self-pay | Admitting: Gastroenterology

## 2022-03-05 VITALS — BP 140/90 | HR 76 | Temp 97.7°F | Ht 66.0 in | Wt 181.6 lb

## 2022-03-05 DIAGNOSIS — R7989 Other specified abnormal findings of blood chemistry: Secondary | ICD-10-CM

## 2022-03-05 DIAGNOSIS — K559 Vascular disorder of intestine, unspecified: Secondary | ICD-10-CM | POA: Diagnosis not present

## 2022-03-05 DIAGNOSIS — K5900 Constipation, unspecified: Secondary | ICD-10-CM

## 2022-03-05 MED ORDER — LUBIPROSTONE 24 MCG PO CAPS: 24.0000 ug | ORAL_CAPSULE | Freq: Two times a day (BID) | ORAL | 3 refills | Status: DC

## 2022-03-05 NOTE — Patient Instructions (Addendum)
I have sent in [place] for constipation. You can take one or two daily with food for constipation. If too expensive, please don't buy, and just let us know. You may find that you can take only 3-4 times per week and have adequate results.  ?Colonoscopy to be scheduled. Sample provided today. You will be called next week with additional details. IT IS IMPORTANT THAT YOUR BOWELS ARE MOVING MORE REGULARLY ESPECIALLY THE 1-2 WEEKS BEFORE YOUR COLONOSCOPY. LET ME KNOW IF AMITIZA IS NOT HELPING! ?I will find out more about any work up you have had done for elevated liver labs. Further recommendations to follow.  ?

## 2022-03-05 NOTE — Progress Notes (Signed)
? ? ? ?GI Office Note   ? ?Referring Provider: Berenice Primas, NP ?Primary Care Physician:  Berenice Primas  ?Primary Gastroenterologist: Garfield Cornea, MD ? ? ?Chief Complaint  ? ?Chief Complaint  ?Patient presents with  ? Follow-up  ?  No further issues with rectal bleeding since hospital visit.   ? ? ?History of Present Illness  ? ?Kelli Deleon is a 76 y.o. female presenting today for hospital follow-up of ischemic colitis.  Patient was hospitalized back in January when she presented with acute onset abdominal pain and rectal bleeding.  CTA abdomen and pelvis completed with no active GI bleeding.  Colitis noted involving the descending and sigmoid colon.  Celiac and SMA widely patent.  Chronic proximal IMA occlusion.  Patient had reported last colonoscopy around 5 years ago at [facility], diverticulosis, no polyps.  She has 2 siblings with colon polyps.  She was advised to follow-up as an outpatient and consider updating colonoscopy. ? ?While inpatient, her hemoglobin remained normal. Lifelong constipation. Strains and can go days witout a BM. Sometimes 7-8 days without a BM. Eating apples. Sometimes takes stool softener. Usually takes liquid dulcolax if no BM after multiple days. Miralax causes vomiting. Gags on liquid medication. Vomited with trilyte, threw up after 3 glasses. She is somewhat worried about colonoscopy as she had a friend to had perforated colon with their colonoscopy.  Denies ugi symptoms.  ?  ?Patient has known infrarenal abdominal aortic aneurysm measuring 4.4cm at time of CTA 11/2021. States she is followed by Dr. Curt Jews. ? ?Medications  ? ?Current Outpatient Medications  ?Medication Sig Dispense Refill  ? amLODipine (NORVASC) 5 MG tablet Take 5 mg by mouth every evening.     ? Cholecalciferol (VITAMIN D3) 1000 units CAPS Take 1,000 Units by mouth daily.     ? cyclobenzaprine (FLEXERIL) 10 MG tablet Take 10 mg by mouth at bedtime.    ? DULoxetine (CYMBALTA) 30 MG capsule  Take 30 mg by mouth every evening.     ? methocarbamol (ROBAXIN) 500 MG tablet Take 500 mg by mouth 2 (two) times daily.    ? Multiple Vitamins-Minerals (CENTRUM SILVER 50+WOMEN PO) Take 1 tablet by mouth daily.    ? pantoprazole (PROTONIX) 40 MG tablet Take 40 mg by mouth daily as needed (acid reflux).     ? phenylephrine (SUDAFED PE) 10 MG TABS tablet Take 10 mg by mouth every 6 (six) hours as needed (sinus headaches).    ? rosuvastatin (CRESTOR) 10 MG tablet Take 10 mg by mouth 3 (three) times a week. Mon, wed, fri    ? ?No current facility-administered medications for this visit.  ? ? ?Allergies  ? ?Allergies as of 03/05/2022 - Review Complete 03/05/2022  ?Allergen Reaction Noted  ? Hydrocodone-acetaminophen Hives and Itching 11/26/2014  ? Carbamazepine Other (See Comments) 11/27/2014  ? Gabapentin Itching and Nausea And Vomiting 11/27/2014  ? Lisinopril Cough 11/27/2014  ? Morphine Nausea And Vomiting 11/26/2014  ? Pregabalin Swelling 11/26/2014  ? ?  ?Past Medical History  ? ?Past Medical History:  ?Diagnosis Date  ? AAA (abdominal aortic aneurysm) ([place])   ? CKD (chronic kidney disease)   ? Fibromyalgia   ? GERD (gastroesophageal reflux disease)   ? High cholesterol   ? Hypertension   ? ? ?Past Surgical History  ? ?Past Surgical History:  ?Procedure Laterality Date  ? ABDOMINAL HYSTERECTOMY    ? CATARACT EXTRACTION W/PHACO Right 06/22/2019  ? Procedure: CATARACT EXTRACTION PHACO AND  INTRAOCULAR LENS PLACEMENT (IOC);  Surgeon: Baruch Goldmann, MD;  Location: [facility];  Service: Ophthalmology;  Laterality: Right;  CDE: 6.30  ? CATARACT EXTRACTION W/PHACO Left 07/06/2019  ? Procedure: CATARACT EXTRACTION PHACO AND INTRAOCULAR LENS PLACEMENT (IOC);  Surgeon: Baruch Goldmann, MD;  Location: [facility];  Service: Ophthalmology;  Laterality: Left;  left, CDE: 4.88  ? STAPEDES SURGERY Bilateral   ? 1975 and #2 in 1990's  ? ? ?Past Family History  ? ?Family History  ?Problem Relation Age of Onset  ? Cancer Mother   ?     lung  ?  Tuberculosis Mother   ? Tuberculosis Father   ? Stroke Father   ? COPD Sister   ? Hypertension Sister   ? Anxiety disorder Sister   ? Hypertension Sister   ? Heart disease Brother   ? Hypertension Brother   ? Atrial fibrillation Brother   ? Neuropathy Brother   ? Cancer Brother   ? Cancer Brother   ? Cancer Brother   ? Kidney disease Brother   ? Lung disease Brother   ? Heart attack Brother   ? Pneumonia Brother   ? Heart attack Brother   ? Colon cancer Neg Hx   ? ? ?Past Social History  ? ?Social History  ? ?Socioeconomic History  ? Marital status: Married  ?  Spouse name: Not on file  ? Number of children: Not on file  ? Years of education: Not on file  ? Highest education level: Not on file  ?Occupational History  ? Not on file  ?Tobacco Use  ? Smoking status: Every Day  ?  Packs/day: 1.00  ?  Years: 60.00  ?  Pack years: 60.00  ?  Types: Cigarettes  ? Smokeless tobacco: Never  ?Vaping Use  ? Vaping Use: Every day  ? Start date: 08/15/2017  ?Substance and Sexual Activity  ? Alcohol use: No  ? Drug use: No  ? Sexual activity: Not Currently  ?Other Topics Concern  ? Not on file  ?Social History Narrative  ? Not on file  ? ?Social Determinants of Health  ? ?Financial Resource Strain: Not on file  ?Food Insecurity: Not on file  ?Transportation Needs: Not on file  ?Physical Activity: Not on file  ?Stress: Not on file  ?Social Connections: Not on file  ?Intimate Partner Violence: Not on file  ? ? ?Review of Systems  ? ?General: Negative for anorexia, weight loss, fever, chills, fatigue, weakness. ?ENT: Negative for hoarseness, difficulty swallowing , nasal congestion. ?CV: Negative for chest pain, angina, palpitations, dyspnea on exertion, peripheral edema.  ?Respiratory: Negative for dyspnea at rest, dyspnea on exertion, cough, sputum, wheezing.  ?GI: See history of present illness. ?GU:  Negative for dysuria, hematuria, urinary incontinence, urinary frequency, nocturnal urination.  ?Endo: Negative for unusual weight  change.  ?   ?Physical Exam  ? ?BP (!) 180/80 (BP Location: Right Arm, Patient Position: Sitting, Cuff Size: Normal)   Pulse 76   Temp 97.7 ?F (36.5 ?C) (Temporal)   Ht '5\' 6"'$  (1.676 m)   Wt 181 lb 9.6 oz (82.4 kg)   SpO2 98%   BMI 29.31 kg/m?  ?  ?General: Well-nourished, well-developed in no acute distress.  ?Eyes: No icterus. ?Mouth: Oropharyngeal mucosa moist and pink , no lesions erythema or exudate. ?Lungs: Clear to auscultation bilaterally.  ?Heart: Regular rate and rhythm, no murmurs rubs or gallops.  ?Abdomen: Bowel sounds are normal, nontender, nondistended, no hepatosplenomegaly or masses,  ?no  abdominal bruits or hernia , no rebound or guarding.  ?Rectal: not performed ?Extremities: No lower extremity edema. No clubbing or deformities. ?Neuro: Alert and oriented x 4   ?Skin: Warm and dry, no jaundice.   ?Psych: Alert and cooperative, normal mood and affect. ? ?Labs  ? ?Lab Results  ?Component Value Date  ? CREATININE 1.09 (H) 12/11/2021  ? BUN 9 12/11/2021  ? NA 142 12/11/2021  ? K 4.0 12/11/2021  ? CL 103 12/11/2021  ? CO2 27 12/11/2021  ? ?Lab Results  ?Component Value Date  ? WBC 10.8 (H) 12/11/2021  ? HGB 14.5 12/11/2021  ? HCT 43.4 12/11/2021  ? MCV 92.9 12/11/2021  ? PLT 290 12/11/2021  ? ?Lab Results  ?Component Value Date  ? ALT 38 12/11/2021  ? AST 58 (H) 12/11/2021  ? ALKPHOS 67 12/11/2021  ? BILITOT 0.5 12/11/2021  ? ?Labs from 12/2021: BUN 11, Cre 1.14, T bili 0.4 AP 91, AST 53H, ALT 39H, TSH 4.3, WBC 10100, Hgb 15, Platelets 345000 ? ?Imaging Studies  ? ?No results found. ? ?Assessment  ? ?Constipation: poorly controlled.  ? ?Ischemic colitis: Hgb remained stable during recent admission. CTA as outlined. SMA and celiac patent. Chronic proximal occlusion of IMA with collaterals. Plans for colonoscopy to evaluate CT findings of left colon to exclude underlying other etiologies.  ? ?Elevated AST/ALT: patient unaware. 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 Liver unremarkable on CTA. ? ? ? ? ? ? ?PLAN  ? ?Amitiza 24 mcg once to twice daily with food for constipation.  ?Colonoscopy to be scheduled. ASA 3.  I have discussed the risks, alternatives, bene

## 2022-03-08 ENCOUNTER — Telehealth: Payer: Self-pay

## 2022-03-08 NOTE — Telephone Encounter (Signed)
Tried to call pt to schedule TCS w/Propofol ASA 2 w/Dr. Gala Romney, [facility] for return call. ?

## 2022-03-08 NOTE — Telephone Encounter (Signed)
Last ov 03/05/22 ?

## 2022-03-08 NOTE — Telephone Encounter (Signed)
Spoke to pt, TCS scheduled for 03/26/22 at 9:30am. Orders entered. Clenpiq sample was given to pt at [facility]. Instructions mailed. ? ?Kelli Deleon, pt requested for you to call her. She said you had mentioned something to her during OV and she has some questions for you. ?

## 2022-03-15 ENCOUNTER — Encounter: Payer: Self-pay | Admitting: Gastroenterology

## 2022-03-15 NOTE — Progress Notes (Signed)
Received labs from PCP. Recommend checking for Hep B and Hep C, including immunity to Hep A/B, iron overload work up, celiac, autoimmune hepatitis.  ?

## 2022-03-15 NOTE — Telephone Encounter (Signed)
Tried to call patient. Requested return call to office tomorrow.  ? ?Also need to have patient complete some labs to further evaluate abnormal LFTs. I reviewed labs received from PCP. She has had slightly elevated AST/ALT several times.  ? ?Please arrange for the following: ?Hep B surface antigen ?Hep B surface antibody ?Hep C antibody ?Hep A total antibody ?TTG IgA ?IgM/IgG/IgA ?ANA ?AMA ?ASMA ?Iron/tibc/ferritin ? ? ? ?

## 2022-03-16 ENCOUNTER — Other Ambulatory Visit: Payer: Self-pay

## 2022-03-16 ENCOUNTER — Telehealth: Payer: Self-pay | Admitting: Gastroenterology

## 2022-03-16 DIAGNOSIS — R7989 Other specified abnormal findings of blood chemistry: Secondary | ICD-10-CM

## 2022-03-16 NOTE — Telephone Encounter (Signed)
Pt called back wanting to know if the lactulose will interfere with the reflux medicine that she takes. Pt also wants to know if she is to continue taking the lactulose the week of her colonoscopy. Pt was informed to go to [facility] to have the requested labs drawn as soon as possible.  ?

## 2022-03-16 NOTE — Telephone Encounter (Signed)
Please call patient, she has several questions  ?

## 2022-03-16 NOTE — Telephone Encounter (Signed)
See other note

## 2022-03-17 DIAGNOSIS — R7989 Other specified abnormal findings of blood chemistry: Secondary | ICD-10-CM | POA: Diagnosis not present

## 2022-03-17 NOTE — Telephone Encounter (Signed)
Lactulose will not interfere with pantoprazole. She should definitely continue lactulose the week of colonoscopy. She does not need to take it the day she takes her bowel prep. ? ?Let me know if she has any other questions or still needs me to call her. ?

## 2022-03-17 NOTE — Telephone Encounter (Signed)
Pt was made aware and verbalized understanding, pt will be going to have labs drawn today. ?

## 2022-03-17 NOTE — Telephone Encounter (Signed)
Lmom for pt to return my call.  

## 2022-03-23 ENCOUNTER — Telehealth: Payer: Self-pay | Admitting: *Deleted

## 2022-03-23 LAB — HEPATITIS C ANTIBODY
Hepatitis C Ab: NONREACTIVE
SIGNAL TO CUT-OFF: 0.08 (ref <1.00)

## 2022-03-23 LAB — IGG, IGA, IGM
IgG (Immunoglobin G), Serum: 1094 mg/dL (ref 600–1540)
IgM, Serum: 88 mg/dL (ref 50–300)
Immunoglobulin A: 161 mg/dL (ref 70–320)

## 2022-03-23 LAB — IRON,TIBC AND FERRITIN PANEL
%SAT: 20 % (calc) (ref 16–45)
Ferritin: 67 ng/mL (ref 16–288)
Iron: 79 ug/dL (ref 45–160)
TIBC: 396 mcg/dL (calc) (ref 250–450)

## 2022-03-23 LAB — HEPATITIS B SURFACE ANTIGEN: Hepatitis B Surface Ag: NONREACTIVE

## 2022-03-23 LAB — HEPATITIS A ANTIBODY, TOTAL: Hepatitis A AB,Total: REACTIVE — AB

## 2022-03-23 LAB — ANTI-SMOOTH MUSCLE ANTIBODY, IGG: Actin (Smooth Muscle) Antibody (IGG): <20 U (ref <20)

## 2022-03-23 LAB — TISSUE TRANSGLUTAMINASE, IGA: (tTG) Ab, IgA: <1 U/mL

## 2022-03-23 LAB — MITOCHONDRIAL ANTIBODIES: Mitochondrial M2 Ab, IgG: <=20 U (ref <=20.0)

## 2022-03-23 LAB — ANA: Anti Nuclear Antibody (ANA): NEGATIVE

## 2022-03-23 LAB — HEPATITIS B SURFACE ANTIBODY,QUALITATIVE: Hep B S Ab: REACTIVE — AB

## 2022-03-23 NOTE — Telephone Encounter (Signed)
Spoke with pt. She needs to cancel procedure for Friday with Dr. Gala Romney. She will call back to r/s. Message sent to endo ?

## 2022-03-26 ENCOUNTER — Encounter (HOSPITAL_COMMUNITY): Admission: RE | Payer: Self-pay | Source: Home / Self Care

## 2022-03-26 ENCOUNTER — Ambulatory Visit (HOSPITAL_COMMUNITY): Admission: RE | Admit: 2022-03-26 | Payer: Medicare HMO | Source: Home / Self Care | Admitting: Internal Medicine

## 2022-03-26 SURGERY — COLONOSCOPY WITH PROPOFOL
Anesthesia: Monitor Anesthesia Care

## 2022-07-14 DIAGNOSIS — U071 COVID-19: Secondary | ICD-10-CM | POA: Diagnosis not present

## 2022-07-14 DIAGNOSIS — Z299 Encounter for prophylactic measures, unspecified: Secondary | ICD-10-CM | POA: Diagnosis not present

## 2022-07-14 DIAGNOSIS — Z6831 Body mass index (BMI) 31.0-31.9, adult: Secondary | ICD-10-CM | POA: Diagnosis not present

## 2022-09-15 ENCOUNTER — Other Ambulatory Visit: Payer: Self-pay

## 2022-09-15 DIAGNOSIS — I714 Abdominal aortic aneurysm, without rupture, unspecified: Secondary | ICD-10-CM

## 2022-09-17 DIAGNOSIS — Z23 Encounter for immunization: Secondary | ICD-10-CM | POA: Diagnosis not present

## 2022-09-17 DIAGNOSIS — R5383 Other fatigue: Secondary | ICD-10-CM | POA: Diagnosis not present

## 2022-09-17 DIAGNOSIS — M797 Fibromyalgia: Secondary | ICD-10-CM | POA: Diagnosis not present

## 2022-09-17 DIAGNOSIS — Z6830 Body mass index (BMI) 30.0-30.9, adult: Secondary | ICD-10-CM | POA: Diagnosis not present

## 2022-09-17 DIAGNOSIS — I10 Essential (primary) hypertension: Secondary | ICD-10-CM | POA: Diagnosis not present

## 2022-09-17 DIAGNOSIS — Z299 Encounter for prophylactic measures, unspecified: Secondary | ICD-10-CM | POA: Diagnosis not present

## 2022-09-20 DIAGNOSIS — E7800 Pure hypercholesterolemia, unspecified: Secondary | ICD-10-CM | POA: Diagnosis not present

## 2022-09-20 DIAGNOSIS — M797 Fibromyalgia: Secondary | ICD-10-CM | POA: Diagnosis not present

## 2022-10-22 IMAGING — CT CT CTA ABD/PEL W/CM AND/OR W/O CM
3 of 12 series · 11 of 46 positions shown, 17 images · IV contrast (agent unspecified)
Comparison: CTA 11/12/2020

CLINICAL DATA: Abdominal pain, lower GI bleed

EXAM:
CTA ABDOMEN AND PELVIS WITHOUT AND WITH CONTRAST
TECHNIQUE: Multidetector CT imaging of the abdomen and pelvis was performed
using the standard protocol during bolus administration of
intravenous contrast. Multiplanar reconstructed images and MIPs were
obtained and reviewed to evaluate the vascular anatomy.

[Series 9: mesenteric axial arterial · axial · arterial · 0.79mm/px · z∈[+701,+955]mm · 6 of 223 slices shown]
[im 16/223  soft-tissue]
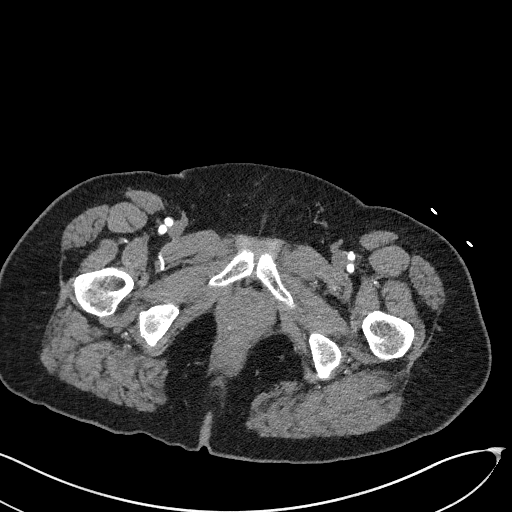
[im 48/223  soft-tissue]
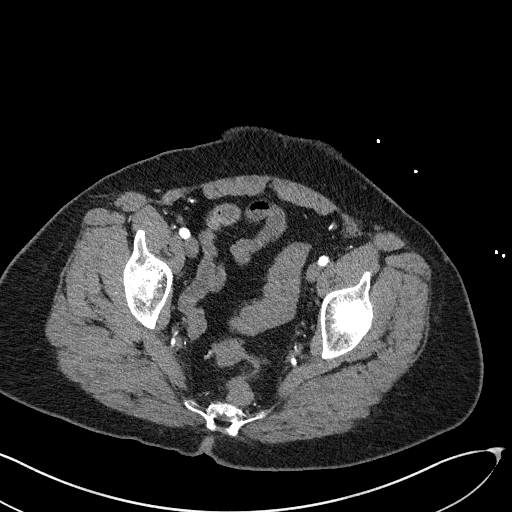
[im 80/223  soft-tissue]
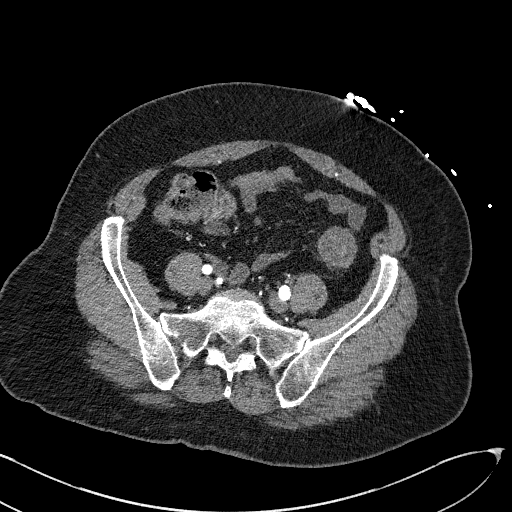
[im 96/223  soft-tissue]
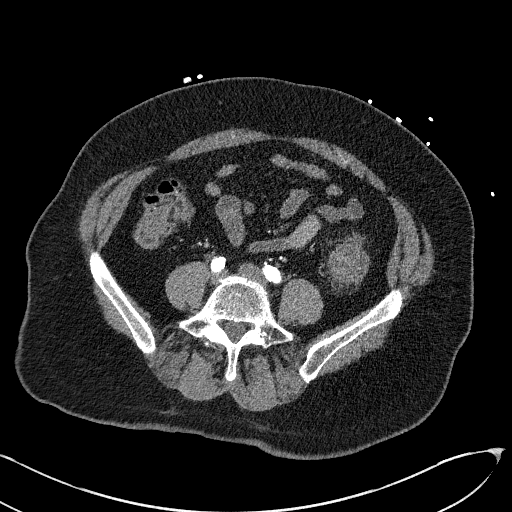
[im 127/223  soft-tissue]
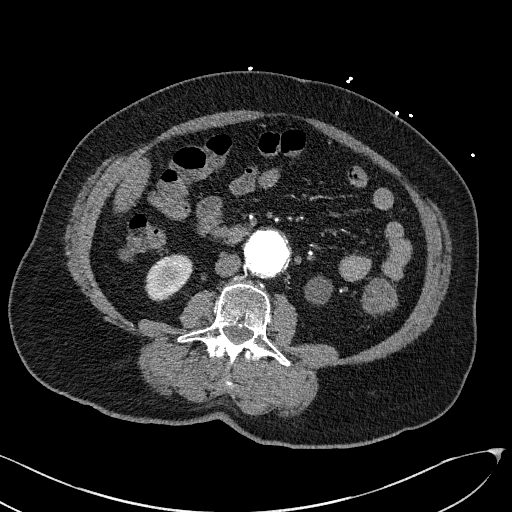
[im 143/223  soft-tissue]
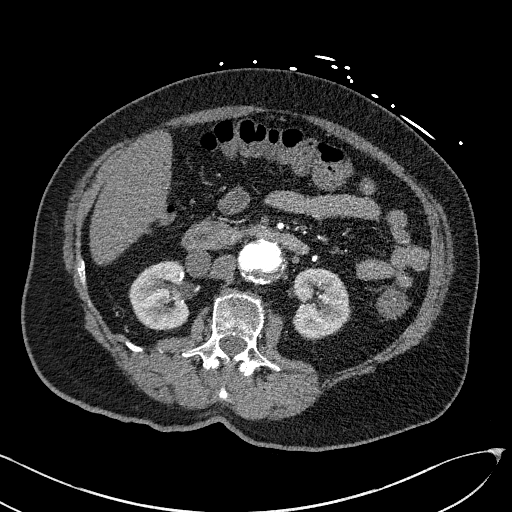

[Series 11: coronals · coronal · 0.79mm/px · 1 of 168 slices shown, 2 images]
[im 84/168  soft-tissue]
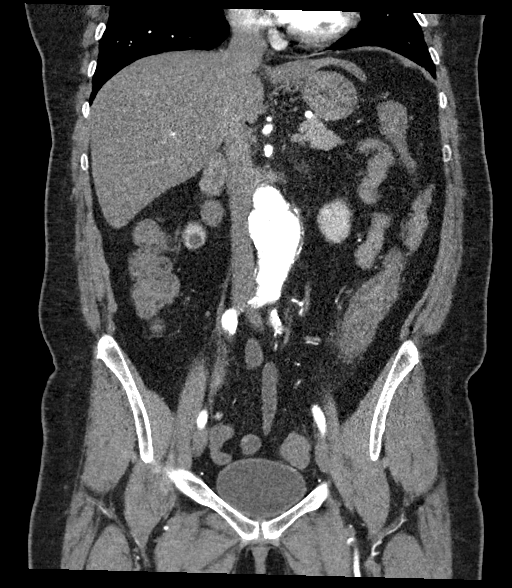
[im 84/168  bone]
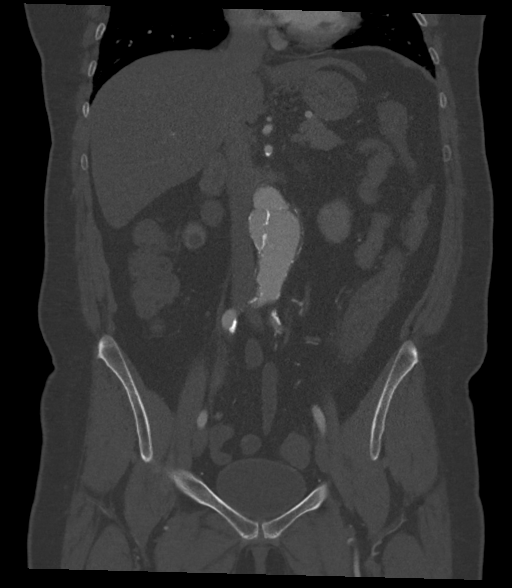

[Series 15: axial venous · axial · portal-venous · 0.81mm/px · z∈[+755,+1025]mm · 4 of 90 slices shown, 9 images]
[im 18/90  soft-tissue]
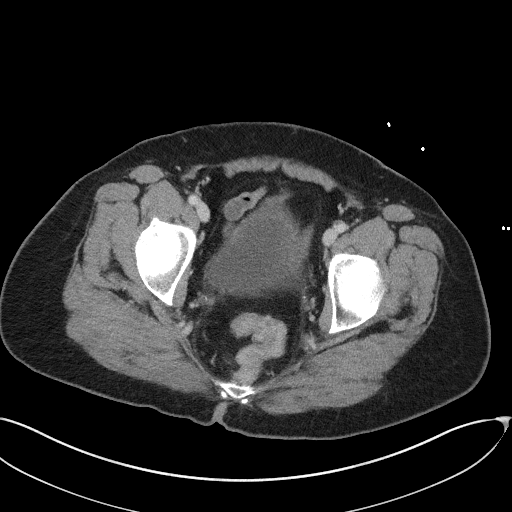
[im 18/90  lung]
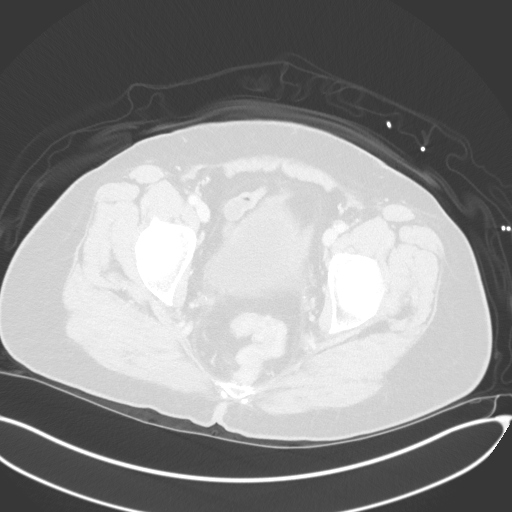
[im 18/90  bone]
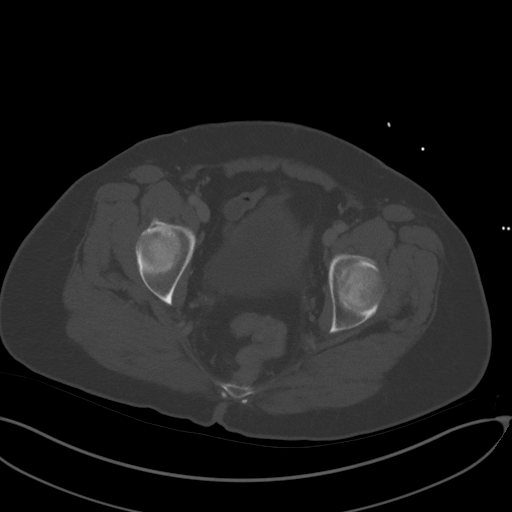
[im 36/90  soft-tissue]
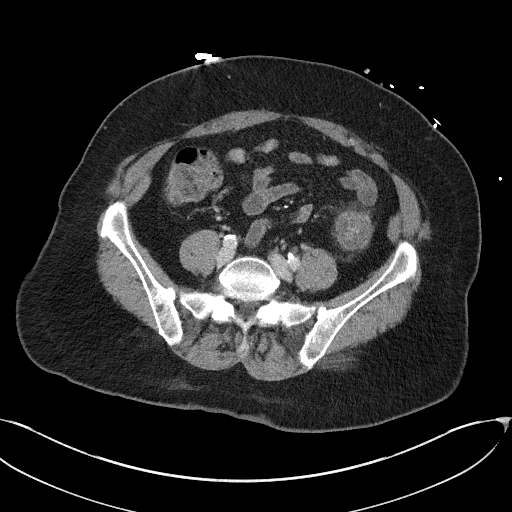
[im 36/90  lung]
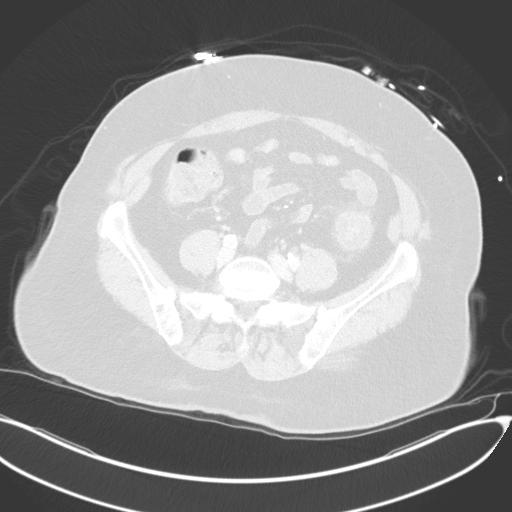
[im 54/90  soft-tissue]
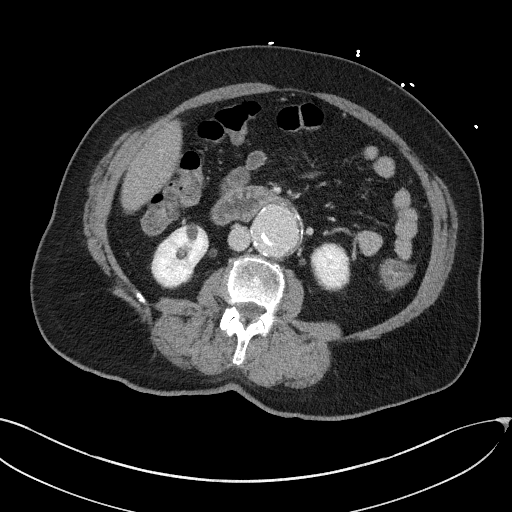
[im 54/90  lung]
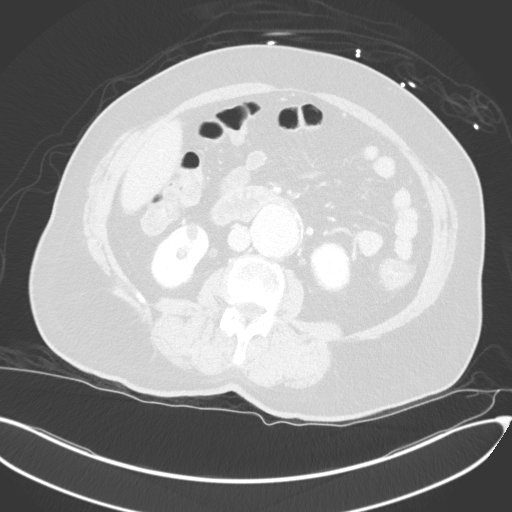
[im 72/90  soft-tissue]
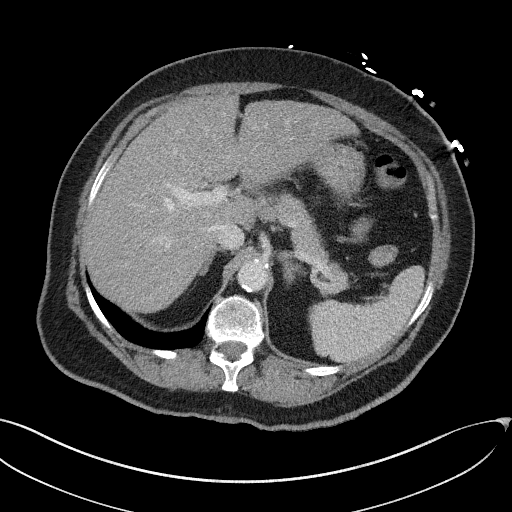
[im 72/90  lung]
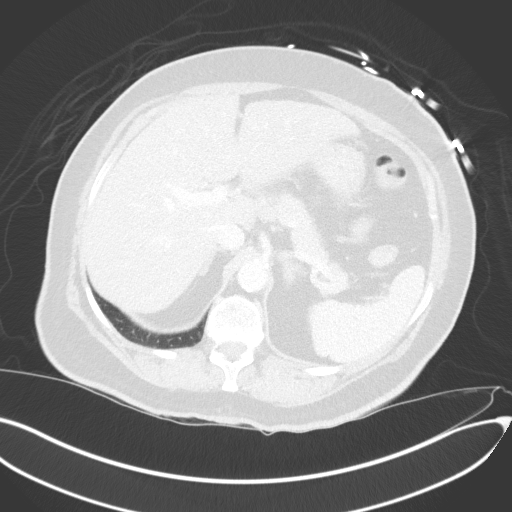

[11 of 46 positions shown; findings below may reference images not displayed]

RADIATION DOSE REDUCTION: This exam was performed according to the
departmental dose-optimization program which includes automated
exposure control, adjustment of the mA and/or kV according to
patient size and/or use of iterative reconstruction technique.

CONTRAST:  100mL OMNIPAQUE IOHEXOL 350 MG/ML SOLN
FINDINGS: VASCULAR

Aorta: Extensive calcified atheromatous plaque. Fusiform infrarenal
aneurysm measuring 4.4 x 4.1 cm maximum transverse diameter
(previously 4.3). There is a curvilinear partially calcified
intraluminal flap as before probably related to right lateral
penetrating atheromatous ulcer. No evidence of leak or impending
rupture.

Celiac: Patent without evidence of aneurysm, dissection, vasculitis
or significant stenosis.

SMA: Patent without evidence of aneurysm, dissection, vasculitis or
significant stenosis. Replaced right hepatic arterial supply, an
anatomic variant.

Renals: Duplicated left, superior dominant, with calcified ostial
plaque resulting in short segment stenosis of at least mild
severity, patent distally. Duplicated right, inferior dominant, both
patent.

IMA: Short-segment origin occlusion, reconstituted distally by
visceral collaterals.

Inflow: Scattered calcified atheromatous plaque. No aneurysm,
dissection, or stenosis.

Proximal Outflow: Mildly atheromatous, patent

Veins: Patent hepatic veins, portal vein, [REDACTED] V, splenic vein,
bilateral renal veins, iliac venous system and IVC. No venous
pathology evident.

Review of the MIP images confirms the above findings.

NON-VASCULAR

Lower chest: Coronary calcifications. No pleural or pericardial
effusion.

Hepatobiliary: No focal liver abnormality is seen. No gallstones,
gallbladder wall thickening, or biliary dilatation.

Pancreas: Unremarkable. No pancreatic ductal dilatation or
surrounding inflammatory changes.

Spleen: Normal in size without focal abnormality.

Adrenals/Urinary Tract: No adrenal mass. Multiple cortical lesions
are again seen throughout both kidneys, some of which can be
characterized as cysts. No urolithiasis or hydronephrosis. Urinary
bladder is physiologically distended.

Stomach/Bowel: Stomach is incompletely distended, unremarkable.
Small bowel decompressed. Appendix not identified. The colon is
nondilated. Circumferential wall thickening in the distal descending
and proximal sigmoid colon with mild adjacent regional inflammatory
change. No focus of active extravasation. A few scattered sigmoid
diverticula containing hyperdense material.

Lymphatic: No abdominal or pelvic adenopathy.

Reproductive: Status post hysterectomy. No adnexal masses.

Other: No ascites.  No free air.

Musculoskeletal: No acute or significant osseous findings.
IMPRESSION: 1. No  evidence of active GI bleed.
2. Colitis involving the descending and sigmoid colon, new since
prior study.
3. Celiac axis and superior mesenteric artery are widely patent.
Chronic proximal IMA occlusion.
4. Stable 4.4 cm infrarenal abdominal aortic aneurysm. Recommend
follow-up every 12 months and vascular consultation. This
recommendation follows ACR consensus guidelines: White Paper of the
ACR Incidental Findings Committee II on Vascular Findings. [HOSPITAL] 0337; [DATE].

Critical Value/emergent results were called by telephone at the time
of interpretation on 12/10/2021 at [DATE] to provider JALUDIN
BATNA , who verbally acknowledged these results.

## 2022-11-03 ENCOUNTER — Ambulatory Visit: Payer: Medicare HMO | Admitting: Vascular Surgery

## 2022-11-03 ENCOUNTER — Other Ambulatory Visit: Payer: Medicare HMO

## 2022-12-01 ENCOUNTER — Ambulatory Visit: Payer: Medicare HMO | Admitting: Vascular Surgery

## 2022-12-01 ENCOUNTER — Encounter: Payer: Self-pay | Admitting: Vascular Surgery

## 2022-12-01 ENCOUNTER — Ambulatory Visit (INDEPENDENT_AMBULATORY_CARE_PROVIDER_SITE_OTHER): Payer: Medicare HMO

## 2022-12-01 VITALS — BP 162/90 | HR 69 | Temp 97.9°F | Ht 66.0 in | Wt 178.4 lb

## 2022-12-01 DIAGNOSIS — I714 Abdominal aortic aneurysm, without rupture, unspecified: Secondary | ICD-10-CM | POA: Diagnosis not present

## 2022-12-01 NOTE — Progress Notes (Signed)
[facility] [place]  Patient name: Kelli Deleon MRN: [id number] DOB: [birth year] Sex: female  REASON FOR VISIT: Follow-up known infrarenal abdominal aortic aneurysm  HPI: Kelli Deleon is a 77 y.o. female here today for follow-up.  She reports that she has had COVID and RSV since her last visit but has had no other major health issues.  She has no symptoms referable to her aneurysm.  She did have a GI bleed in January 2023 and underwent CT scan for evaluation of that and I have this for review.  Past Medical History:  Diagnosis Date   AAA (abdominal aortic aneurysm) ([facility])    CKD (chronic kidney disease)    Fibromyalgia    GERD (gastroesophageal reflux disease)    High cholesterol    Hypertension     Family History  Problem Relation Age of Onset   Cancer Mother        lung   Tuberculosis Mother    Tuberculosis Father    Stroke Father    COPD Sister    Hypertension Sister    Anxiety disorder Sister    Hypertension Sister    Heart disease Brother    Hypertension Brother    Atrial fibrillation Brother    Neuropathy Brother    Cancer Brother    Cancer Brother    Cancer Brother    Kidney disease Brother    Lung disease Brother    Heart attack Brother    Pneumonia Brother    Heart attack Brother    Colon cancer Neg Hx     SOCIAL HISTORY: Social History   Tobacco Use   Smoking status: Every Day    Packs/day: 1.00    Years: 60.00    Total pack years: 60.00    Types: Cigarettes   Smokeless tobacco: Never  Substance Use Topics   Alcohol use: No    Allergies  Allergen Reactions   Hydrocodone-Acetaminophen Hives and Itching   Carbamazepine Other (See Comments)    Fatigue, intolerance, trigeminal neuralgia   Gabapentin Itching and Nausea And Vomiting    Headache   Lisinopril Cough    aching, musculoskeletal   Morphine Nausea And Vomiting   Pregabalin Swelling    Current Outpatient Medications   Medication Sig Dispense Refill   amLODipine (NORVASC) 5 MG tablet Take 5 mg by mouth every evening.      Cholecalciferol (VITAMIN D3) 1000 units CAPS Take 1,000 Units by mouth daily.      cyclobenzaprine (FLEXERIL) 10 MG tablet Take 10 mg by mouth at bedtime.     DULoxetine (CYMBALTA) 30 MG capsule Take 30 mg by mouth every evening.      methocarbamol (ROBAXIN) 500 MG tablet Take 500 mg by mouth 2 (two) times daily.     Multiple Vitamins-Minerals (CENTRUM SILVER 50+WOMEN PO) Take 1 tablet by mouth daily.     pantoprazole (PROTONIX) 40 MG tablet Take 40 mg by mouth daily as needed (acid reflux).      phenylephrine (SUDAFED PE) 10 MG TABS tablet Take 10 mg by mouth every 6 (six) hours as needed (sinus headaches).     rosuvastatin (CRESTOR) 10 MG tablet Take 10 mg by mouth 3 (three) times a week. Mon, wed, fri     No current facility-administered medications for this visit.    REVIEW OF SYSTEMS:  '[X]'$  denotes positive finding, '[ ]'$  denotes negative finding Cardiac  Comments:  Chest pain or chest pressure:    Shortness  of breath upon exertion:    Short of breath when lying flat:    Irregular heart rhythm:        Vascular    Pain in calf, thigh, or hip brought on by ambulation:    Pain in feet at night that wakes you up from your sleep:     Blood clot in your veins:    Leg swelling:           PHYSICAL EXAM: Vitals:   12/01/22 0836  BP: (!) 162/90  Pulse: 69  Temp: 97.9 F (36.6 C)  SpO2: 94%  Weight: 178 lb 6.4 oz (80.9 kg)  Height: '5\' 6"'$  (1.676 m)    GENERAL: The patient is a well-nourished female, in no acute distress. The vital signs are documented above. CARDIOVASCULAR: Plus radial and 2+ popliteal pulses bilaterally.  Abdomen soft and nontender.  I do not palpate an aneurysm PULMONARY: There is good air exchange  MUSCULOSKELETAL: There are no major deformities or cyanosis. NEUROLOGIC: No focal weakness or paresthesias are detected. SKIN: There are no ulcers or rashes  noted. PSYCHIATRIC: The patient has a normal affect.  DATA:  Ultrasound today reveals maximal diameter of 4.5 cm.  This compares to 4.6 cm by ultrasound in December 2022.  By CT scan in January 2023 her maximal diameter was 4.4 cm.  CT scan did suggest that she did have adequate infrarenal neck for endovascular stent graft should she continue expansion  Indication for repair.  She does have a slight reverse taper.  MEDICAL ISSUES: Labile infrarenal abdominal aortic aneurysm.  We will see her again in 1 year with repeat ultrasound    Rosetta Posner, MD FACS [facility] [phone]  Note: Portions of this report may have been transcribed using voice recognition software.  Every effort has been made to ensure accuracy; however, inadvertent computerized transcription errors may still be present.

## 2022-12-09 DIAGNOSIS — M47816 Spondylosis without myelopathy or radiculopathy, lumbar region: Secondary | ICD-10-CM | POA: Diagnosis not present

## 2022-12-09 DIAGNOSIS — M9903 Segmental and somatic dysfunction of lumbar region: Secondary | ICD-10-CM | POA: Diagnosis not present

## 2022-12-13 DIAGNOSIS — M47816 Spondylosis without myelopathy or radiculopathy, lumbar region: Secondary | ICD-10-CM | POA: Diagnosis not present

## 2022-12-13 DIAGNOSIS — M9903 Segmental and somatic dysfunction of lumbar region: Secondary | ICD-10-CM | POA: Diagnosis not present

## 2022-12-15 DIAGNOSIS — M9903 Segmental and somatic dysfunction of lumbar region: Secondary | ICD-10-CM | POA: Diagnosis not present

## 2022-12-15 DIAGNOSIS — M47816 Spondylosis without myelopathy or radiculopathy, lumbar region: Secondary | ICD-10-CM | POA: Diagnosis not present

## 2022-12-29 ENCOUNTER — Telehealth: Payer: Self-pay

## 2023-01-04 DIAGNOSIS — R69 Illness, unspecified: Secondary | ICD-10-CM | POA: Diagnosis not present

## 2023-01-04 DIAGNOSIS — Z6830 Body mass index (BMI) 30.0-30.9, adult: Secondary | ICD-10-CM | POA: Diagnosis not present

## 2023-01-04 DIAGNOSIS — E559 Vitamin D deficiency, unspecified: Secondary | ICD-10-CM | POA: Diagnosis not present

## 2023-01-04 DIAGNOSIS — E7800 Pure hypercholesterolemia, unspecified: Secondary | ICD-10-CM | POA: Diagnosis not present

## 2023-01-04 DIAGNOSIS — Z0000 Encounter for general adult medical examination without abnormal findings: Secondary | ICD-10-CM | POA: Diagnosis not present

## 2023-01-04 DIAGNOSIS — Z1331 Encounter for screening for depression: Secondary | ICD-10-CM | POA: Diagnosis not present

## 2023-01-04 DIAGNOSIS — Z79899 Other long term (current) drug therapy: Secondary | ICD-10-CM | POA: Diagnosis not present

## 2023-01-04 DIAGNOSIS — F17210 Nicotine dependence, cigarettes, uncomplicated: Secondary | ICD-10-CM | POA: Diagnosis not present

## 2023-01-04 DIAGNOSIS — Z7189 Other specified counseling: Secondary | ICD-10-CM | POA: Diagnosis not present

## 2023-01-04 DIAGNOSIS — Z299 Encounter for prophylactic measures, unspecified: Secondary | ICD-10-CM | POA: Diagnosis not present

## 2023-01-04 DIAGNOSIS — Z1339 Encounter for screening examination for other mental health and behavioral disorders: Secondary | ICD-10-CM | POA: Diagnosis not present

## 2023-01-04 DIAGNOSIS — R5383 Other fatigue: Secondary | ICD-10-CM | POA: Diagnosis not present

## 2023-01-04 DIAGNOSIS — I10 Essential (primary) hypertension: Secondary | ICD-10-CM | POA: Diagnosis not present

## 2023-01-04 DIAGNOSIS — M8580 Other specified disorders of bone density and structure, unspecified site: Secondary | ICD-10-CM | POA: Diagnosis not present

## 2023-01-04 DIAGNOSIS — Z289 Immunization not carried out for unspecified reason: Secondary | ICD-10-CM | POA: Diagnosis not present

## 2023-01-21 DIAGNOSIS — Z299 Encounter for prophylactic measures, unspecified: Secondary | ICD-10-CM | POA: Diagnosis not present

## 2023-01-21 DIAGNOSIS — F17210 Nicotine dependence, cigarettes, uncomplicated: Secondary | ICD-10-CM | POA: Diagnosis not present

## 2023-01-21 DIAGNOSIS — I10 Essential (primary) hypertension: Secondary | ICD-10-CM | POA: Diagnosis not present

## 2023-01-21 DIAGNOSIS — R69 Illness, unspecified: Secondary | ICD-10-CM | POA: Diagnosis not present

## 2023-01-21 DIAGNOSIS — R5383 Other fatigue: Secondary | ICD-10-CM | POA: Diagnosis not present

## 2023-01-21 DIAGNOSIS — M797 Fibromyalgia: Secondary | ICD-10-CM | POA: Diagnosis not present

## 2023-01-21 DIAGNOSIS — G4700 Insomnia, unspecified: Secondary | ICD-10-CM | POA: Diagnosis not present

## 2023-01-25 DIAGNOSIS — D72829 Elevated white blood cell count, unspecified: Secondary | ICD-10-CM | POA: Diagnosis not present

## 2023-04-12 DIAGNOSIS — I700 Atherosclerosis of aorta: Secondary | ICD-10-CM | POA: Diagnosis not present

## 2023-04-12 DIAGNOSIS — F17210 Nicotine dependence, cigarettes, uncomplicated: Secondary | ICD-10-CM | POA: Diagnosis not present

## 2023-04-12 DIAGNOSIS — Z299 Encounter for prophylactic measures, unspecified: Secondary | ICD-10-CM | POA: Diagnosis not present

## 2023-04-12 DIAGNOSIS — N183 Chronic kidney disease, stage 3 unspecified: Secondary | ICD-10-CM | POA: Diagnosis not present

## 2023-04-12 DIAGNOSIS — I10 Essential (primary) hypertension: Secondary | ICD-10-CM | POA: Diagnosis not present

## 2023-04-12 DIAGNOSIS — Z0000 Encounter for general adult medical examination without abnormal findings: Secondary | ICD-10-CM | POA: Diagnosis not present

## 2023-04-12 DIAGNOSIS — I714 Abdominal aortic aneurysm, without rupture, unspecified: Secondary | ICD-10-CM | POA: Diagnosis not present

## 2023-04-15 DIAGNOSIS — Z79899 Other long term (current) drug therapy: Secondary | ICD-10-CM | POA: Diagnosis not present

## 2023-04-15 DIAGNOSIS — E2839 Other primary ovarian failure: Secondary | ICD-10-CM | POA: Diagnosis not present

## 2023-04-15 DIAGNOSIS — M818 Other osteoporosis without current pathological fracture: Secondary | ICD-10-CM | POA: Diagnosis not present

## 2023-07-14 DIAGNOSIS — Z299 Encounter for prophylactic measures, unspecified: Secondary | ICD-10-CM | POA: Diagnosis not present

## 2023-07-14 DIAGNOSIS — F17210 Nicotine dependence, cigarettes, uncomplicated: Secondary | ICD-10-CM | POA: Diagnosis not present

## 2023-07-14 DIAGNOSIS — I10 Essential (primary) hypertension: Secondary | ICD-10-CM | POA: Diagnosis not present

## 2023-07-14 DIAGNOSIS — M797 Fibromyalgia: Secondary | ICD-10-CM | POA: Diagnosis not present

## 2023-09-20 DIAGNOSIS — R059 Cough, unspecified: Secondary | ICD-10-CM | POA: Diagnosis not present

## 2023-09-20 DIAGNOSIS — I700 Atherosclerosis of aorta: Secondary | ICD-10-CM | POA: Diagnosis not present

## 2023-09-20 DIAGNOSIS — F17200 Nicotine dependence, unspecified, uncomplicated: Secondary | ICD-10-CM | POA: Diagnosis not present

## 2023-09-20 DIAGNOSIS — R0789 Other chest pain: Secondary | ICD-10-CM | POA: Diagnosis not present

## 2023-09-29 ENCOUNTER — Other Ambulatory Visit: Payer: Self-pay | Admitting: Internal Medicine

## 2023-09-29 DIAGNOSIS — Z1231 Encounter for screening mammogram for malignant neoplasm of breast: Secondary | ICD-10-CM

## 2023-10-19 ENCOUNTER — Ambulatory Visit
Admission: RE | Admit: 2023-10-19 | Discharge: 2023-10-19 | Disposition: A | Discharge status: Home / Self Care | Payer: Medicare HMO | Source: Ambulatory Visit | Attending: Nurse Practitioner | Admitting: Nurse Practitioner

## 2023-10-19 DIAGNOSIS — Z1231 Encounter for screening mammogram for malignant neoplasm of breast: Secondary | ICD-10-CM | POA: Diagnosis not present

## 2023-11-28 ENCOUNTER — Other Ambulatory Visit: Payer: Self-pay

## 2023-11-28 DIAGNOSIS — I714 Abdominal aortic aneurysm, without rupture, unspecified: Secondary | ICD-10-CM

## 2023-11-29 ENCOUNTER — Ambulatory Visit (INDEPENDENT_AMBULATORY_CARE_PROVIDER_SITE_OTHER): Payer: Medicare HMO

## 2023-11-29 ENCOUNTER — Encounter: Payer: Self-pay | Admitting: Vascular Surgery

## 2023-11-29 ENCOUNTER — Ambulatory Visit (INDEPENDENT_AMBULATORY_CARE_PROVIDER_SITE_OTHER): Payer: Medicare HMO | Admitting: Vascular Surgery

## 2023-11-29 VITALS — BP 182/96 | HR 65 | Ht 66.0 in | Wt 186.0 lb

## 2023-11-29 DIAGNOSIS — I714 Abdominal aortic aneurysm, without rupture, unspecified: Secondary | ICD-10-CM | POA: Diagnosis not present

## 2023-11-29 DIAGNOSIS — I7143 Infrarenal abdominal aortic aneurysm, without rupture: Secondary | ICD-10-CM | POA: Diagnosis not present

## 2023-11-29 NOTE — Progress Notes (Signed)
 [facility] [place]  ASSESSMENT / PLAN: Kelli Deleon is a 78 y.o. female with a infrarenal abdominal aortic aneurysm measuring 40mm.  Recommend:  Abstinence from all tobacco products. Blood glucose control with goal A1c < 7%. Blood pressure control with goal blood pressure < 140/90 mmHg. Lipid reduction therapy with goal LDL-C <100 mg/dL.  Aspirin 81mg  PO QD.  Atorvastatin 40-80mg  PO QD (or other high intensity statin therapy).  Follow up with me in 12 months with AAA duplex  CHIEF COMPLAINT: AAA surveillance  HISTORY OF PRESENT ILLNESS: Kelli Deleon is a 78 y.o. female who returns to abdominal aortic aneurysm.  We reviewed her duplex in detail.  She has no symptoms attributable to her aneurysm.  Her granddaughter is surveyor, mining as a estate agent, and is engaged to be married.  Past Medical History:  Diagnosis Date   AAA (abdominal aortic aneurysm) (HCC)    CKD (chronic kidney disease)    Fibromyalgia    GERD (gastroesophageal reflux disease)    High cholesterol    Hypertension     Past Surgical History:  Procedure Laterality Date   ABDOMINAL HYSTERECTOMY     CATARACT EXTRACTION W/PHACO Right 06/22/2019   Procedure: CATARACT EXTRACTION PHACO AND INTRAOCULAR LENS PLACEMENT (IOC);  Surgeon: Harrie Agent, MD;  Location: [facility];  Service: Ophthalmology;  Laterality: Right;  CDE: 6.30   CATARACT EXTRACTION W/PHACO Left 07/06/2019   Procedure: CATARACT EXTRACTION PHACO AND INTRAOCULAR LENS PLACEMENT (IOC);  Surgeon: Harrie Agent, MD;  Location: [facility];  Service: Ophthalmology;  Laterality: Left;  left, CDE: 4.88   STAPEDES SURGERY Bilateral    1975 and #2 in 1990's    Family History  Problem Relation Age of Onset   Cancer Mother        lung   Tuberculosis Mother    Tuberculosis Father    Stroke Father    COPD Sister    Hypertension Sister    Anxiety disorder Sister    Hypertension Sister    Heart disease Brother     Hypertension Brother    Atrial fibrillation Brother    Neuropathy Brother    Cancer Brother    Cancer Brother    Cancer Brother    Kidney disease Brother    Lung disease Brother    Heart attack Brother    Pneumonia Brother    Heart attack Brother    Colon cancer Neg Hx    Breast cancer Neg Hx     Social History   Socioeconomic History   Marital status: Married    Spouse name: Not on file   Number of children: Not on file   Years of education: Not on file   Highest education level: Not on file  Occupational History   Not on file  Tobacco Use   Smoking status: Every Day    Current packs/day: 1.00    Average packs/day: 1 pack/day for 60.0 years (60.0 ttl pk-yrs)    Types: Cigarettes   Smokeless tobacco: Never  Vaping Use   Vaping status: Former   Start date: 08/15/2017  Substance and Sexual Activity   Alcohol  use: No   Drug use: No   Sexual activity: Not Currently  Other Topics Concern   Not on file  Social History Narrative   Not on file   Social Drivers of Health   Financial Resource Strain: Not on file  Food Insecurity: Not on file  Transportation Needs: Not on file  Physical  Activity: Not on file  Stress: Not on file  Social Connections: Not on file  Intimate Partner Violence: Not on file    Allergies  Allergen Reactions   Hydrocodone-Acetaminophen  Hives and Itching   Carbamazepine Other (See Comments)    Fatigue, intolerance, trigeminal neuralgia   Gabapentin Itching and Nausea And Vomiting    Headache   Lisinopril Cough    aching, musculoskeletal   Morphine  Nausea And Vomiting   Pregabalin Swelling    Current Outpatient Medications  Medication Sig Dispense Refill   amLODipine (NORVASC) 5 MG tablet Take 5 mg by mouth every evening.      Cholecalciferol  (VITAMIN D3) 1000 units CAPS Take 1,000 Units by mouth daily.      cyclobenzaprine  (FLEXERIL ) 10 MG tablet Take 10 mg by mouth at bedtime.     DULoxetine  (CYMBALTA ) 30 MG capsule Take 30 mg by  mouth every evening.      methocarbamol  (ROBAXIN ) 500 MG tablet Take 500 mg by mouth 2 (two) times daily.     Multiple Vitamins-Minerals (CENTRUM SILVER 50+WOMEN PO) Take 1 tablet by mouth daily.     pantoprazole (PROTONIX) 40 MG tablet Take 40 mg by mouth daily as needed (acid reflux).      phenylephrine  (SUDAFED PE) 10 MG TABS tablet Take 10 mg by mouth every 6 (six) hours as needed (sinus headaches).     rosuvastatin  (CRESTOR ) 10 MG tablet Take 10 mg by mouth 3 (three) times a week. Mon, wed, fri     No current facility-administered medications for this visit.    PHYSICAL EXAM Vitals:   11/29/23 1026  BP: (!) 182/96  Pulse: 65  Weight: 186 lb (84.4 kg)  Height: 5' 6 (1.676 m)   Elderly woman in no distress Regular rate and rhythm Unlabored breathing Nondistended abdomen  PERTINENT LABORATORY AND RADIOLOGIC DATA  Most recent CBC    Latest Ref Rng & Units 12/11/2021    4:51 AM 12/10/2021    9:16 AM 08/07/2018    2:31 PM  CBC  WBC 4.0 - 10.5 K/uL 10.8  11.9  11.3   Hemoglobin 12.0 - 15.0 g/dL 85.4  85.1  86.2   Hematocrit 36.0 - 46.0 % 43.4  45.0  41.1   Platelets 150 - 400 K/uL 290  333  344      Most recent CMP    Latest Ref Rng & Units 12/11/2021    4:51 AM 12/10/2021    9:16 AM 11/12/2020   12:06 PM  CMP  Glucose 70 - 99 mg/dL 895  892    BUN 8 - 23 mg/dL 9  12    Creatinine 9.55 - 1.00 mg/dL 8.90  8.86  8.79   Sodium 135 - 145 mmol/L 142  136    Potassium 3.5 - 5.1 mmol/L 4.0  3.9    Chloride 98 - 111 mmol/L 103  102    CO2 22 - 32 mmol/L 27  23    Calcium  8.9 - 10.3 mg/dL 8.5  9.2    Total Protein 6.5 - 8.1 g/dL 6.8  7.5    Total Bilirubin 0.3 - 1.2 mg/dL 0.5  0.6    Alkaline Phos 38 - 126 U/L 67  74    AST 15 - 41 U/L 58  53    ALT 0 - 44 U/L 38  42     AAA duplex today shows a 4 cm infrarenal abdominal aortic aneurysm  Debby SAILOR. Magda, MD FACS [facility] [employer] Number: [phone] 11/29/2023 10:38  AM   Total time spent on preparing this encounter including chart review, data review, collecting history, examining the patient, coordinating care for this established patient, 30 minutes.  Portions of this report may have been transcribed using voice recognition software.  Every effort has been made to ensure accuracy; however, inadvertent computerized transcription errors may still be present.

## 2023-12-06 ENCOUNTER — Other Ambulatory Visit: Payer: Self-pay

## 2023-12-06 DIAGNOSIS — I7143 Infrarenal abdominal aortic aneurysm, without rupture: Secondary | ICD-10-CM

## 2024-01-18 DIAGNOSIS — R5383 Other fatigue: Secondary | ICD-10-CM | POA: Diagnosis not present

## 2024-01-18 DIAGNOSIS — M810 Age-related osteoporosis without current pathological fracture: Secondary | ICD-10-CM | POA: Diagnosis not present

## 2024-01-18 DIAGNOSIS — Z79899 Other long term (current) drug therapy: Secondary | ICD-10-CM | POA: Diagnosis not present

## 2024-01-18 DIAGNOSIS — Z0000 Encounter for general adult medical examination without abnormal findings: Secondary | ICD-10-CM | POA: Diagnosis not present

## 2024-01-18 DIAGNOSIS — E7800 Pure hypercholesterolemia, unspecified: Secondary | ICD-10-CM | POA: Diagnosis not present

## 2024-01-23 DIAGNOSIS — I10 Essential (primary) hypertension: Secondary | ICD-10-CM | POA: Diagnosis not present

## 2024-01-27 DIAGNOSIS — R0602 Shortness of breath: Secondary | ICD-10-CM | POA: Diagnosis not present

## 2024-01-27 DIAGNOSIS — J432 Centrilobular emphysema: Secondary | ICD-10-CM | POA: Diagnosis not present

## 2024-01-27 DIAGNOSIS — J439 Emphysema, unspecified: Secondary | ICD-10-CM | POA: Diagnosis not present

## 2024-01-27 DIAGNOSIS — J984 Other disorders of lung: Secondary | ICD-10-CM | POA: Diagnosis not present

## 2024-01-27 DIAGNOSIS — K7689 Other specified diseases of liver: Secondary | ICD-10-CM | POA: Diagnosis not present

## 2024-01-27 DIAGNOSIS — R059 Cough, unspecified: Secondary | ICD-10-CM | POA: Diagnosis not present

## 2024-02-06 DIAGNOSIS — I7143 Infrarenal abdominal aortic aneurysm, without rupture: Secondary | ICD-10-CM | POA: Diagnosis not present

## 2024-02-06 DIAGNOSIS — K769 Liver disease, unspecified: Secondary | ICD-10-CM | POA: Diagnosis not present

## 2024-02-06 DIAGNOSIS — K760 Fatty (change of) liver, not elsewhere classified: Secondary | ICD-10-CM | POA: Diagnosis not present

## 2024-02-06 DIAGNOSIS — N2889 Other specified disorders of kidney and ureter: Secondary | ICD-10-CM | POA: Diagnosis not present

## 2024-02-15 DIAGNOSIS — Z1212 Encounter for screening for malignant neoplasm of rectum: Secondary | ICD-10-CM | POA: Diagnosis not present

## 2024-02-15 DIAGNOSIS — Z1211 Encounter for screening for malignant neoplasm of colon: Secondary | ICD-10-CM | POA: Diagnosis not present

## 2024-03-14 DIAGNOSIS — I10 Essential (primary) hypertension: Secondary | ICD-10-CM | POA: Diagnosis not present

## 2024-03-28 DIAGNOSIS — R5383 Other fatigue: Secondary | ICD-10-CM | POA: Diagnosis not present

## 2024-03-28 DIAGNOSIS — I10 Essential (primary) hypertension: Secondary | ICD-10-CM | POA: Diagnosis not present

## 2024-03-28 DIAGNOSIS — R200 Anesthesia of skin: Secondary | ICD-10-CM | POA: Diagnosis not present

## 2024-04-14 DIAGNOSIS — I10 Essential (primary) hypertension: Secondary | ICD-10-CM | POA: Diagnosis not present

## 2024-05-14 DIAGNOSIS — I10 Essential (primary) hypertension: Secondary | ICD-10-CM | POA: Diagnosis not present

## 2024-06-14 DIAGNOSIS — I10 Essential (primary) hypertension: Secondary | ICD-10-CM | POA: Diagnosis not present

## 2024-07-12 DIAGNOSIS — R918 Other nonspecific abnormal finding of lung field: Secondary | ICD-10-CM | POA: Diagnosis not present

## 2024-07-12 DIAGNOSIS — R935 Abnormal findings on diagnostic imaging of other abdominal regions, including retroperitoneum: Secondary | ICD-10-CM | POA: Diagnosis not present

## 2024-07-12 DIAGNOSIS — I7143 Infrarenal abdominal aortic aneurysm, without rupture: Secondary | ICD-10-CM | POA: Diagnosis not present

## 2024-07-14 DIAGNOSIS — I10 Essential (primary) hypertension: Secondary | ICD-10-CM | POA: Diagnosis not present

## 2024-07-27 ENCOUNTER — Encounter: Payer: Self-pay | Admitting: Internal Medicine

## 2024-08-06 ENCOUNTER — Other Ambulatory Visit (HOSPITAL_COMMUNITY): Payer: Self-pay | Admitting: Internal Medicine

## 2024-08-06 DIAGNOSIS — R9389 Abnormal findings on diagnostic imaging of other specified body structures: Secondary | ICD-10-CM

## 2024-08-14 DIAGNOSIS — I10 Essential (primary) hypertension: Secondary | ICD-10-CM | POA: Diagnosis not present

## 2024-08-16 ENCOUNTER — Encounter (HOSPITAL_COMMUNITY)
Admission: RE | Admit: 2024-08-16 | Discharge: 2024-08-16 | Disposition: A | Discharge status: Home / Self Care | Source: Ambulatory Visit | Attending: Internal Medicine | Admitting: Internal Medicine

## 2024-08-16 DIAGNOSIS — R9389 Abnormal findings on diagnostic imaging of other specified body structures: Secondary | ICD-10-CM | POA: Diagnosis not present

## 2024-08-16 DIAGNOSIS — I2510 Atherosclerotic heart disease of native coronary artery without angina pectoris: Secondary | ICD-10-CM | POA: Diagnosis not present

## 2024-08-16 DIAGNOSIS — R911 Solitary pulmonary nodule: Secondary | ICD-10-CM | POA: Diagnosis not present

## 2024-08-16 DIAGNOSIS — I7143 Infrarenal abdominal aortic aneurysm, without rupture: Secondary | ICD-10-CM | POA: Diagnosis not present

## 2024-08-16 DIAGNOSIS — N281 Cyst of kidney, acquired: Secondary | ICD-10-CM | POA: Diagnosis not present

## 2024-08-16 MED ORDER — FLUDEOXYGLUCOSE F - 18 (FDG) INJECTION
10.1500 | Freq: Once | INTRAVENOUS | Status: AC | PRN
  Administered 2024-08-16: 10.15 via INTRAVENOUS

## 2024-09-14 DIAGNOSIS — I10 Essential (primary) hypertension: Secondary | ICD-10-CM | POA: Diagnosis not present

## 2024-09-19 ENCOUNTER — Encounter: Payer: Self-pay | Admitting: Pulmonary Disease

## 2024-10-29 DIAGNOSIS — H524 Presbyopia: Secondary | ICD-10-CM | POA: Diagnosis not present

## 2024-10-29 DIAGNOSIS — H52223 Regular astigmatism, bilateral: Secondary | ICD-10-CM | POA: Diagnosis not present

## 2024-10-29 DIAGNOSIS — H5203 Hypermetropia, bilateral: Secondary | ICD-10-CM | POA: Diagnosis not present

## 2024-11-13 ENCOUNTER — Other Ambulatory Visit (HOSPITAL_BASED_OUTPATIENT_CLINIC_OR_DEPARTMENT_OTHER): Payer: Self-pay

## 2024-11-13 MED ORDER — OLMESARTAN MEDOXOMIL 20 MG PO TABS: 20.0000 mg | ORAL_TABLET | Freq: Every day | ORAL | 3 refills | Status: AC

## 2024-11-13 MED ORDER — ROSUVASTATIN CALCIUM 10 MG PO TABS
10.0000 mg | ORAL_TABLET | Freq: Every day | ORAL | 3 refills | Status: AC
  Filled 2024-11-23 – 2024-12-10 (×2): qty 90, 90d supply, fill #0

## 2024-11-13 MED ORDER — DULOXETINE HCL 30 MG PO CPEP: 30.0000 mg | ORAL_CAPSULE | Freq: Two times a day (BID) | ORAL | 1 refills | Status: AC

## 2024-11-23 ENCOUNTER — Other Ambulatory Visit (HOSPITAL_BASED_OUTPATIENT_CLINIC_OR_DEPARTMENT_OTHER): Payer: Self-pay

## 2024-11-28 ENCOUNTER — Other Ambulatory Visit: Payer: Self-pay | Admitting: Vascular Surgery

## 2024-11-28 DIAGNOSIS — I714 Abdominal aortic aneurysm, without rupture, unspecified: Secondary | ICD-10-CM

## 2024-11-28 DIAGNOSIS — I7143 Infrarenal abdominal aortic aneurysm, without rupture: Secondary | ICD-10-CM

## 2024-12-10 ENCOUNTER — Other Ambulatory Visit (HOSPITAL_COMMUNITY): Payer: Self-pay

## 2024-12-10 ENCOUNTER — Other Ambulatory Visit: Payer: Self-pay

## 2024-12-12 ENCOUNTER — Other Ambulatory Visit (HOSPITAL_BASED_OUTPATIENT_CLINIC_OR_DEPARTMENT_OTHER): Payer: Self-pay

## 2024-12-18 ENCOUNTER — Other Ambulatory Visit

## 2025-01-08 ENCOUNTER — Ambulatory Visit

## 2025-02-05 ENCOUNTER — Other Ambulatory Visit

## 2025-02-05 ENCOUNTER — Ambulatory Visit
